# Patient Record
Sex: Female | Born: 1960 | Race: Black or African American | Hispanic: No | Smoking: Never smoker
Health system: Southern US, Community
[De-identification: ages and names within clinical notes are randomized; demographics above are authoritative.]

## PROBLEM LIST (undated history)

## (undated) DIAGNOSIS — D649 Anemia, unspecified: Secondary | ICD-10-CM

## (undated) DIAGNOSIS — Z9289 Personal history of other medical treatment: Secondary | ICD-10-CM

## (undated) HISTORY — PX: TUBAL LIGATION: SHX77

## (undated) HISTORY — PX: BREAST EXCISIONAL BIOPSY: SUR124

## (undated) HISTORY — PX: COLONOSCOPY: SHX174

## (undated) HISTORY — PX: WISDOM TOOTH EXTRACTION: SHX21

## (undated) HISTORY — PX: BREAST SURGERY: SHX581

---

## 2008-10-04 DIAGNOSIS — Z9289 Personal history of other medical treatment: Secondary | ICD-10-CM

## 2008-10-04 HISTORY — DX: Personal history of other medical treatment: Z92.89

## 2014-08-06 ENCOUNTER — Ambulatory Visit
Admission: RE | Admit: 2014-08-06 | Discharge: 2014-08-06 | Disposition: A | Discharge status: Home / Self Care | Payer: BC Managed Care – PPO | Source: Ambulatory Visit | Attending: Physician Assistant | Admitting: Physician Assistant

## 2014-08-06 ENCOUNTER — Other Ambulatory Visit: Payer: Self-pay | Admitting: Physician Assistant

## 2014-08-06 DIAGNOSIS — R52 Pain, unspecified: Secondary | ICD-10-CM

## 2014-10-07 ENCOUNTER — Other Ambulatory Visit (HOSPITAL_COMMUNITY)
Admission: RE | Admit: 2014-10-07 | Discharge: 2014-10-07 | Disposition: A | Discharge status: Home / Self Care | Payer: BC Managed Care – PPO | Source: Ambulatory Visit | Attending: Obstetrics and Gynecology | Admitting: Obstetrics and Gynecology

## 2014-10-07 ENCOUNTER — Other Ambulatory Visit: Payer: Self-pay | Admitting: Obstetrics and Gynecology

## 2014-10-07 DIAGNOSIS — Z1151 Encounter for screening for human papillomavirus (HPV): Secondary | ICD-10-CM | POA: Insufficient documentation

## 2014-10-07 DIAGNOSIS — Z01419 Encounter for gynecological examination (general) (routine) without abnormal findings: Secondary | ICD-10-CM | POA: Diagnosis present

## 2014-10-08 LAB — CYTOLOGY - PAP

## 2014-12-24 NOTE — Patient Instructions (Addendum)
   Your procedure is scheduled on:  Wednesday, March 30  Enter through the [employer] [facility] at:  [place] up the phone at the desk and dial [phone] and inform us of your arrival.  Please call this number if you have any problems the morning of surgery: [phone]  Remember: Do not eat food after midnight: Tuesday Do not drink clear liquids after: 6 AM Wednesday, day of surgery Take these medicines the morning of surgery with a SIP OF WATER:  None  Do not wear jewelry, make-up, or FINGER nail polish No metal in your hair or on your body. Do not wear lotions, powders, perfumes.  You may wear deodorant.  Do not bring valuables to the hospital. Contacts, dentures or bridgework may not be worn into surgery.  Leave suitcase in the car. After Surgery it may be brought to your room. For patients being admitted to the hospital, checkout time is 11:00am the day of discharge.  Home with daughter Janett Billow cell [phone]

## 2014-12-25 ENCOUNTER — Encounter (HOSPITAL_COMMUNITY)
Admission: RE | Admit: 2014-12-25 | Discharge: 2014-12-25 | Disposition: A | Discharge status: Home / Self Care | Payer: BC Managed Care – PPO | Source: Ambulatory Visit | Attending: Obstetrics and Gynecology | Admitting: Obstetrics and Gynecology

## 2014-12-25 ENCOUNTER — Encounter (HOSPITAL_COMMUNITY): Payer: Self-pay

## 2014-12-25 ENCOUNTER — Other Ambulatory Visit: Payer: Self-pay | Admitting: Obstetrics and Gynecology

## 2014-12-25 DIAGNOSIS — Z01812 Encounter for preprocedural laboratory examination: Secondary | ICD-10-CM | POA: Diagnosis not present

## 2014-12-25 HISTORY — DX: Personal history of other medical treatment: Z92.89

## 2014-12-25 HISTORY — DX: Anemia, unspecified: D64.9

## 2014-12-25 LAB — CBC
HEMATOCRIT: 36.3 % (ref 36.0–46.0)
HEMOGLOBIN: 11.8 g/dL — AB (ref 12.0–15.0)
MCH: 30.3 pg (ref 26.0–34.0)
MCHC: 32.5 g/dL (ref 30.0–36.0)
MCV: 93.3 fL (ref 78.0–100.0)
Platelets: 314 10*3/uL (ref 150–400)
RBC: 3.89 MIL/uL (ref 3.87–5.11)
RDW: 12.4 % (ref 11.5–15.5)
WBC: 4.6 10*3/uL (ref 4.0–10.5)

## 2014-12-25 LAB — ABO/RH: ABO/RH(D): A POS

## 2014-12-25 NOTE — Pre-Procedure Instructions (Signed)
SDS BB History Log given to lab for patient's history of blood transfusion.

## 2014-12-31 ENCOUNTER — Other Ambulatory Visit: Payer: Self-pay | Admitting: Obstetrics and Gynecology

## 2014-12-31 NOTE — H&P (Signed)
Chief Complaint(s):   PreOp history and physical for 01/01/15   HPI:  General 54 y/o presents for preop visit in preparation for robotic hysterectomy with bso. she desires to have her ovaries removed. she has hot flaseh. she has menorrhagia and fibroids. she desires definitive therapy. she has a menses every 26 days. she changes 14 pads on her heaviest day.  On ultrasound 11/04/2014 uterus measures 13.5x 10.4 cm x 9.8 cm. she has multiple uterine fibroids the largest is posterior and submucosal measuring 5.6 cm. Her ovaries are normal.  Current Medication:   None   Medical History:   Anemia-Iron deficiency [facility])   Allergies/Intolerance:   Sulfa Drugs: Side Effects - hives/nauesa     Codeine Sulfate: Side Effects - vomiting     Penicillin: Side Effects - vomiting/hives   Gyn History:   Sexual activity not currently sexually active. Periods : every month, lasts 7 days, 2-3 days are heavy and sometimes will go through 14 pads in a day. LMP 12/09/14. Birth control BTL. Last pap smear date 10/07/14, all negative. Last mammogram date more than two years. Denies Abnormal pap smear. Denies STD.   OB History:   Number of pregnancies 3. Pregnancy # 1 live birth, vaginal delivery. Pregnancy # 2 live birth, vaginal delivery. Pregnancy # 3 live birth, vaginal delivery, blood transfusion postpartum.   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    Denies family hx colon  cancer, colon polyps or liver disease.  Social History:  General Tobacco use cigarettes: Never smoked, Tobacco history last updated 12/24/2014.  no Smoking.  no Alcohol.  Caffeine: yes, very little, soda.  no Recreational drug use.  Exercise: yes.  Occupation: employed, Advertising account planner. asst.  Marital Status: single, Divorced.  Children: 3.  ROS: negative except as stated in hPI.   Objective:  Vitals:  Wt 158, Wt change -3 lb, Ht 62, BMI 28.90, Pulse sitting 72, BP sitting 113/78  Past Results:  Examination:  General Examination alert, oriented, NAD" label="GENERAL APPEARANCE" categoryPropId="[id number]" examid="[id number]"GENERAL APPEARANCE alert, oriented, NAD.  good I:E efffort noted, clear to auscultation bilaterally" label="LUNGS:" categoryPropId="87" examid="[id number]"LUNGS: good I:E efffort noted, clear to auscultation bilaterally.  regular rate and rhythm" label="HEART:" categoryPropId="86" examid="[id number]"HEART: regular rate and rhythm.  soft, non-tender/non-distended, bowel sounds present" label="ABDOMEN:" categoryPropId="88" examid="[id number]"ABDOMEN: soft, non-tender/non-distended, bowel sounds present.  normal external genitalia, labia - unremarkable, vagina - pink moist mucosa, no lesions or abnormal discharge, cervix - no discharge or lesions or CMT, adnexa - no masses or tenderness, uterus nontender 16 wk size uterus. " label="FEMALE GENITOURINARY:" categoryPropId="[id number]" examid="[id number]"FEMALE GENITOURINARY: normal external genitalia, labia - unremarkable, vagina - pink moist mucosa, no lesions or abnormal discharge, cervix - no discharge or lesions or CMT, adnexa - no masses or tenderness, uterus nontender 16 wk size uterus. .  normal range of  motion all joints" label="MUSCULOSKELETAL" categoryPropId="13767" examid="[id number]"MUSCULOSKELETAL normal range of motion all joints.  no edema present" label="EXTREMITIES:" categoryPropId="89" examid="[id number]"EXTREMITIES: no edema present.  Physical  Examination: Chaperone present McCoy,Tiffany 12/24/2014 02:24:35 PM &gt; , for pelvic exam" label="Chaperone present"Chaperone present McCoy,Tiffany 12/24/2014 02:24:35 PM > , for pelvic exam.    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 plan on robotic assisted laparascopic hysterectomy with bso.. d/w the patient r/b/a of surgery including but not limited to infection/ bleeding / damage to bowel bladder ureters with the need for further surgery. r/o converstion to abdominal hysterctomy. r/o transfusion. pt voiced understanding and desires to proceed with robotic hysterectomy.  Submucous leiomyoma of uterus  Notes: robotic hysterctomy for management.  Iron deficiency anemia due to chronic blood loss  Notes: due to menorrhagia.

## 2015-01-01 ENCOUNTER — Encounter (HOSPITAL_COMMUNITY): Payer: Self-pay | Admitting: *Deleted

## 2015-01-01 ENCOUNTER — Encounter (HOSPITAL_COMMUNITY)
Admission: RE | Disposition: A | Discharge status: Home / Self Care | Payer: Self-pay | Source: Ambulatory Visit | Attending: Obstetrics and Gynecology

## 2015-01-01 ENCOUNTER — Ambulatory Visit (HOSPITAL_COMMUNITY): Payer: BC Managed Care – PPO | Admitting: Anesthesiology

## 2015-01-01 ENCOUNTER — Inpatient Hospital Stay (HOSPITAL_COMMUNITY)
Admission: RE | Admit: 2015-01-01 | Discharge: 2015-01-03 | DRG: 742 | Disposition: A | Discharge status: Home / Self Care | Payer: BC Managed Care – PPO | Source: Ambulatory Visit | Attending: Obstetrics and Gynecology | Admitting: Obstetrics and Gynecology

## 2015-01-01 ENCOUNTER — Ambulatory Visit (HOSPITAL_COMMUNITY): Payer: BC Managed Care – PPO

## 2015-01-01 DIAGNOSIS — D259 Leiomyoma of uterus, unspecified: Secondary | ICD-10-CM | POA: Diagnosis present

## 2015-01-01 DIAGNOSIS — Z90722 Acquired absence of ovaries, bilateral: Secondary | ICD-10-CM

## 2015-01-01 DIAGNOSIS — N9961 Intraoperative hemorrhage and hematoma of a genitourinary system organ or structure complicating a genitourinary system procedure: Secondary | ICD-10-CM | POA: Diagnosis not present

## 2015-01-01 DIAGNOSIS — N920 Excessive and frequent menstruation with regular cycle: Secondary | ICD-10-CM | POA: Diagnosis present

## 2015-01-01 DIAGNOSIS — D649 Anemia, unspecified: Secondary | ICD-10-CM | POA: Diagnosis present

## 2015-01-01 DIAGNOSIS — T81509A Unspecified complication of foreign body accidentally left in body following unspecified procedure, initial encounter: Secondary | ICD-10-CM

## 2015-01-01 DIAGNOSIS — N938 Other specified abnormal uterine and vaginal bleeding: Secondary | ICD-10-CM | POA: Diagnosis present

## 2015-01-01 DIAGNOSIS — Z90710 Acquired absence of both cervix and uterus: Secondary | ICD-10-CM

## 2015-01-01 DIAGNOSIS — Z9079 Acquired absence of other genital organ(s): Secondary | ICD-10-CM

## 2015-01-01 HISTORY — PX: ROBOTIC ASSISTED TOTAL HYSTERECTOMY WITH BILATERAL SALPINGO OOPHERECTOMY: SHX6086

## 2015-01-01 LAB — PREPARE RBC (CROSSMATCH)

## 2015-01-01 LAB — PREGNANCY, URINE: Preg Test, Ur: NEGATIVE

## 2015-01-01 LAB — CBC
HCT: 39.5 % (ref 36.0–46.0)
Hemoglobin: 13.5 g/dL (ref 12.0–15.0)
MCH: 31 pg (ref 26.0–34.0)
MCHC: 34.2 g/dL (ref 30.0–36.0)
MCV: 90.6 fL (ref 78.0–100.0)
Platelets: 203 10*3/uL (ref 150–400)
RBC: 4.36 MIL/uL (ref 3.87–5.11)
RDW: 13.8 % (ref 11.5–15.5)
WBC: 22.8 10*3/uL — ABNORMAL HIGH (ref 4.0–10.5)

## 2015-01-01 SURGERY — ROBOTIC ASSISTED TOTAL HYSTERECTOMY WITH BILATERAL SALPINGO OOPHORECTOMY
Anesthesia: General | Site: Abdomen | Laterality: Bilateral

## 2015-01-01 MED ORDER — PHENYLEPHRINE HCL 10 MG/ML IJ SOLN
INTRAMUSCULAR | Status: AC
  Filled 2015-01-01: qty 1

## 2015-01-01 MED ORDER — LACTATED RINGERS IV SOLN
INTRAVENOUS | Status: DC
  Administered 2015-01-01 (×5): via INTRAVENOUS

## 2015-01-01 MED ORDER — HYDROMORPHONE HCL 1 MG/ML IJ SOLN
INTRAMUSCULAR | Status: DC | PRN
  Administered 2015-01-01: 1 mg via INTRAVENOUS

## 2015-01-01 MED ORDER — FENTANYL CITRATE 0.05 MG/ML IJ SOLN
INTRAMUSCULAR | Status: AC
  Filled 2015-01-01: qty 5

## 2015-01-01 MED ORDER — ONDANSETRON HCL 4 MG/2ML IJ SOLN: 4.0000 mg | Freq: Four times a day (QID) | INTRAMUSCULAR | Status: DC | PRN

## 2015-01-01 MED ORDER — ROPIVACAINE HCL 5 MG/ML IJ SOLN
INTRAMUSCULAR | Status: AC
Start: 2015-01-01 — End: 2015-01-01
  Filled 2015-01-01: qty 60

## 2015-01-01 MED ORDER — PHENYLEPHRINE 40 MCG/ML (10ML) SYRINGE FOR IV PUSH (FOR BLOOD PRESSURE SUPPORT)
PREFILLED_SYRINGE | INTRAVENOUS | Status: AC
  Filled 2015-01-01: qty 10

## 2015-01-01 MED ORDER — PANTOPRAZOLE SODIUM 40 MG IV SOLR
40.0000 mg | Freq: Every day | INTRAVENOUS | Status: DC
  Administered 2015-01-02: 40 mg via INTRAVENOUS
  Filled 2015-01-01: qty 40

## 2015-01-01 MED ORDER — NEOSTIGMINE METHYLSULFATE 10 MG/10ML IV SOLN
INTRAVENOUS | Status: AC
  Filled 2015-01-01: qty 1

## 2015-01-01 MED ORDER — SIMETHICONE 80 MG PO CHEW
80.0000 mg | CHEWABLE_TABLET | Freq: Four times a day (QID) | ORAL | Status: DC | PRN
  Administered 2015-01-02 (×2): 80 mg via ORAL
  Filled 2015-01-01 (×2): qty 1

## 2015-01-01 MED ORDER — ARTIFICIAL TEARS OP OINT
TOPICAL_OINTMENT | OPHTHALMIC | Status: DC | PRN
  Administered 2015-01-01: 1 via OPHTHALMIC

## 2015-01-01 MED ORDER — PROMETHAZINE HCL 25 MG/ML IJ SOLN: 6.2500 mg | Freq: Once | INTRAMUSCULAR | Status: DC

## 2015-01-01 MED ORDER — LACTATED RINGERS IV SOLN
INTRAVENOUS | Status: DC
  Administered 2015-01-01 – 2015-01-02 (×2): via INTRAVENOUS

## 2015-01-01 MED ORDER — PROPOFOL 10 MG/ML IV BOLUS
INTRAVENOUS | Status: DC | PRN
  Administered 2015-01-01: 150 mg via INTRAVENOUS

## 2015-01-01 MED ORDER — NALOXONE HCL 0.4 MG/ML IJ SOLN: 0.4000 mg | INTRAMUSCULAR | Status: DC | PRN

## 2015-01-01 MED ORDER — ONDANSETRON HCL 4 MG PO TABS: 4.0000 mg | ORAL_TABLET | Freq: Four times a day (QID) | ORAL | Status: DC | PRN

## 2015-01-01 MED ORDER — SILVER NITRATE-POT NITRATE 75-25 % EX MISC
CUTANEOUS | Status: DC | PRN
  Administered 2015-01-01: 2

## 2015-01-01 MED ORDER — MIDAZOLAM HCL 2 MG/2ML IJ SOLN
INTRAMUSCULAR | Status: DC | PRN
  Administered 2015-01-01: 2 mg via INTRAVENOUS

## 2015-01-01 MED ORDER — DEXAMETHASONE SODIUM PHOSPHATE 10 MG/ML IJ SOLN
INTRAMUSCULAR | Status: AC
  Filled 2015-01-01: qty 1

## 2015-01-01 MED ORDER — NEOSTIGMINE METHYLSULFATE 10 MG/10ML IV SOLN
INTRAVENOUS | Status: DC | PRN
  Administered 2015-01-01: 3 mg via INTRAVENOUS

## 2015-01-01 MED ORDER — CLINDAMYCIN PHOSPHATE 900 MG/50ML IV SOLN
900.0000 mg | Freq: Once | INTRAVENOUS | Status: AC
  Administered 2015-01-01: 900 mg via INTRAVENOUS
  Filled 2015-01-01: qty 50

## 2015-01-01 MED ORDER — PROMETHAZINE HCL 25 MG/ML IJ SOLN
INTRAMUSCULAR | Status: AC
  Filled 2015-01-01: qty 1

## 2015-01-01 MED ORDER — SODIUM CHLORIDE 0.9 % IV SOLN: 10.0000 mL/h | Freq: Once | INTRAVENOUS | Status: DC

## 2015-01-01 MED ORDER — LIDOCAINE HCL (CARDIAC) 20 MG/ML IV SOLN
INTRAVENOUS | Status: DC | PRN
  Administered 2015-01-01: 80 mg via INTRAVENOUS

## 2015-01-01 MED ORDER — SCOPOLAMINE 1 MG/3DAYS TD PT72
MEDICATED_PATCH | TRANSDERMAL | Status: AC
  Administered 2015-01-01: 1.5 mg via TRANSDERMAL
  Filled 2015-01-01: qty 1

## 2015-01-01 MED ORDER — PHENYLEPHRINE 40 MCG/ML (10ML) SYRINGE FOR IV PUSH (FOR BLOOD PRESSURE SUPPORT)
PREFILLED_SYRINGE | INTRAVENOUS | Status: AC
Start: 2015-01-01 — End: 2015-01-01
  Filled 2015-01-01: qty 10

## 2015-01-01 MED ORDER — LIDOCAINE-EPINEPHRINE 1 %-1:100000 IJ SOLN
INTRAMUSCULAR | Status: DC | PRN
  Administered 2015-01-01: 26 mL

## 2015-01-01 MED ORDER — SODIUM CHLORIDE 0.9 % IV SOLN
INTRAVENOUS | Status: DC | PRN
  Administered 2015-01-01: 16:00:00 via INTRAVENOUS

## 2015-01-01 MED ORDER — HYDROMORPHONE 0.3 MG/ML IV SOLN
INTRAVENOUS | Status: DC
  Administered 2015-01-01: 20:00:00 via INTRAVENOUS
  Administered 2015-01-02: 0.6 mg via INTRAVENOUS
  Administered 2015-01-02: 0.9 mg via INTRAVENOUS
  Administered 2015-01-02: 0.6 mg via INTRAVENOUS
  Filled 2015-01-01: qty 25

## 2015-01-01 MED ORDER — ONDANSETRON HCL 4 MG/2ML IJ SOLN
INTRAMUSCULAR | Status: DC | PRN
  Administered 2015-01-01: 4 mg via INTRAVENOUS

## 2015-01-01 MED ORDER — METOCLOPRAMIDE HCL 5 MG/ML IJ SOLN
10.0000 mg | Freq: Once | INTRAMUSCULAR | Status: AC | PRN
  Administered 2015-01-01: 10 mg via INTRAVENOUS

## 2015-01-01 MED ORDER — LIDOCAINE-EPINEPHRINE 1 %-1:100000 IJ SOLN
INTRAMUSCULAR | Status: AC
  Filled 2015-01-01: qty 1

## 2015-01-01 MED ORDER — HYDROMORPHONE HCL 1 MG/ML IJ SOLN
0.5000 mg | INTRAMUSCULAR | Status: DC | PRN
  Administered 2015-01-01: 0.5 mg via INTRAVENOUS

## 2015-01-01 MED ORDER — FENTANYL CITRATE 0.05 MG/ML IJ SOLN: 25.0000 ug | INTRAMUSCULAR | Status: DC | PRN

## 2015-01-01 MED ORDER — DIPHENHYDRAMINE HCL 12.5 MG/5ML PO ELIX: 12.5000 mg | ORAL_SOLUTION | Freq: Four times a day (QID) | ORAL | Status: DC | PRN

## 2015-01-01 MED ORDER — SCOPOLAMINE 1 MG/3DAYS TD PT72
1.0000 | MEDICATED_PATCH | Freq: Once | TRANSDERMAL | Status: DC
  Administered 2015-01-01: 1.5 mg via TRANSDERMAL

## 2015-01-01 MED ORDER — HYDROMORPHONE HCL 1 MG/ML IJ SOLN
INTRAMUSCULAR | Status: AC
  Filled 2015-01-01: qty 1

## 2015-01-01 MED ORDER — SODIUM CHLORIDE 0.9 % IJ SOLN
INTRAMUSCULAR | Status: AC
  Filled 2015-01-01: qty 10

## 2015-01-01 MED ORDER — SILVER NITRATE-POT NITRATE 75-25 % EX MISC
CUTANEOUS | Status: AC
  Filled 2015-01-01: qty 1

## 2015-01-01 MED ORDER — PROPOFOL 10 MG/ML IV BOLUS
INTRAVENOUS | Status: AC
  Filled 2015-01-01: qty 20

## 2015-01-01 MED ORDER — ONDANSETRON HCL 4 MG/2ML IJ SOLN
INTRAMUSCULAR | Status: AC
  Filled 2015-01-01: qty 2

## 2015-01-01 MED ORDER — LACTATED RINGERS IR SOLN
Status: DC | PRN
  Administered 2015-01-01: 3000 mL

## 2015-01-01 MED ORDER — PHENYLEPHRINE HCL 10 MG/ML IJ SOLN
INTRAMUSCULAR | Status: DC | PRN
  Administered 2015-01-01 (×5): 80 ug via INTRAVENOUS
  Administered 2015-01-01 (×2): 40 ug via INTRAVENOUS
  Administered 2015-01-01 (×5): 80 ug via INTRAVENOUS
  Administered 2015-01-01 (×2): 40 ug via INTRAVENOUS

## 2015-01-01 MED ORDER — GENTAMICIN SULFATE 40 MG/ML IJ SOLN
INTRAVENOUS | Status: AC
  Administered 2015-01-01: 113.25 mL via INTRAVENOUS
  Filled 2015-01-01: qty 7.25

## 2015-01-01 MED ORDER — DEXAMETHASONE SODIUM PHOSPHATE 4 MG/ML IJ SOLN
INTRAMUSCULAR | Status: DC | PRN
  Administered 2015-01-01: 4 mg via INTRAVENOUS

## 2015-01-01 MED ORDER — SODIUM CHLORIDE 0.9 % IV SOLN
INTRAVENOUS | Status: DC | PRN
  Administered 2015-01-01: 80 mL
  Administered 2015-01-01: 10 mL

## 2015-01-01 MED ORDER — FENTANYL CITRATE 0.05 MG/ML IJ SOLN
INTRAMUSCULAR | Status: DC | PRN
  Administered 2015-01-01: 50 ug via INTRAVENOUS
  Administered 2015-01-01 (×2): 25 ug via INTRAVENOUS
  Administered 2015-01-01 (×3): 50 ug via INTRAVENOUS
  Administered 2015-01-01: 100 ug via INTRAVENOUS
  Administered 2015-01-01: 25 ug via INTRAVENOUS

## 2015-01-01 MED ORDER — MIDAZOLAM HCL 2 MG/2ML IJ SOLN
INTRAMUSCULAR | Status: AC
  Filled 2015-01-01: qty 2

## 2015-01-01 MED ORDER — ALUM & MAG HYDROXIDE-SIMETH 200-200-20 MG/5ML PO SUSP: 30.0000 mL | ORAL | Status: DC | PRN

## 2015-01-01 MED ORDER — SODIUM CHLORIDE 0.9 % IJ SOLN
INTRAMUSCULAR | Status: AC
  Filled 2015-01-01: qty 50

## 2015-01-01 MED ORDER — LIDOCAINE HCL (CARDIAC) 20 MG/ML IV SOLN
INTRAVENOUS | Status: AC
  Filled 2015-01-01: qty 5

## 2015-01-01 MED ORDER — DIPHENHYDRAMINE HCL 50 MG/ML IJ SOLN: 12.5000 mg | Freq: Four times a day (QID) | INTRAMUSCULAR | Status: DC | PRN

## 2015-01-01 MED ORDER — ROCURONIUM BROMIDE 100 MG/10ML IV SOLN
INTRAVENOUS | Status: DC | PRN
  Administered 2015-01-01: 45 mg via INTRAVENOUS
  Administered 2015-01-01 (×2): 10 mg via INTRAVENOUS
  Administered 2015-01-01: 5 mg via INTRAVENOUS
  Administered 2015-01-01: 10 mg via INTRAVENOUS

## 2015-01-01 MED ORDER — GLYCOPYRROLATE 0.2 MG/ML IJ SOLN
INTRAMUSCULAR | Status: DC | PRN
  Administered 2015-01-01: 0.1 mg via INTRAVENOUS
  Administered 2015-01-01: .4 mg via INTRAVENOUS

## 2015-01-01 MED ORDER — SODIUM CHLORIDE 0.9 % IJ SOLN: 9.0000 mL | INTRAMUSCULAR | Status: DC | PRN

## 2015-01-01 MED ORDER — MEPERIDINE HCL 25 MG/ML IJ SOLN: 6.2500 mg | INTRAMUSCULAR | Status: DC | PRN

## 2015-01-01 MED ORDER — METOCLOPRAMIDE HCL 5 MG/ML IJ SOLN
INTRAMUSCULAR | Status: AC
  Filled 2015-01-01: qty 2

## 2015-01-01 MED ORDER — MENTHOL 3 MG MT LOZG: 1.0000 | LOZENGE | OROMUCOSAL | Status: DC | PRN

## 2015-01-01 SURGICAL SUPPLY — 84 items
BARRIER ADHS 3X4 INTERCEED (GAUZE/BANDAGES/DRESSINGS) IMPLANT
BENZOIN TINCTURE PRP APPL 2/3 (GAUZE/BANDAGES/DRESSINGS) ×3 IMPLANT
CATH FOLEY 3WAY  5CC 16FR (CATHETERS) ×2
CATH FOLEY 3WAY 5CC 16FR (CATHETERS) ×1 IMPLANT
CLEANER TIP ELECTROSURG 2X2 (MISCELLANEOUS) ×3 IMPLANT
CLOSURE WOUND 1/4X4 (GAUZE/BANDAGES/DRESSINGS) ×1
CONT PATH 16OZ SNAP LID 3702 (MISCELLANEOUS) ×3 IMPLANT
COVER BACK TABLE 60X90IN (DRAPES) ×6 IMPLANT
COVER TIP SHEARS 8 DVNC (MISCELLANEOUS) ×1 IMPLANT
COVER TIP SHEARS 8MM DA VINCI (MISCELLANEOUS) ×2
DECANTER SPIKE VIAL GLASS SM (MISCELLANEOUS) ×12 IMPLANT
DILATOR CANAL MILEX (MISCELLANEOUS) ×3 IMPLANT
DRAPE WARM FLUID 44X44 (DRAPE) ×3 IMPLANT
DRESSING OPSITE X SMALL 2X3 (GAUZE/BANDAGES/DRESSINGS) ×3 IMPLANT
DRSG COVADERM PLUS 2X2 (GAUZE/BANDAGES/DRESSINGS) ×12 IMPLANT
DRSG OPSITE POSTOP 3X4 (GAUZE/BANDAGES/DRESSINGS) ×3 IMPLANT
DRSG OPSITE POSTOP 4X10 (GAUZE/BANDAGES/DRESSINGS) ×3 IMPLANT
DURAPREP 26ML APPLICATOR (WOUND CARE) ×3 IMPLANT
ELECT LIGASURE SHORT 9 REUSE (ELECTRODE) ×3 IMPLANT
ELECT REM PT RETURN 9FT ADLT (ELECTROSURGICAL) ×3
ELECTRODE REM PT RTRN 9FT ADLT (ELECTROSURGICAL) ×1 IMPLANT
GAUZE SPONGE 4X4 16PLY XRAY LF (GAUZE/BANDAGES/DRESSINGS) ×3 IMPLANT
GAUZE VASELINE 3X9 (GAUZE/BANDAGES/DRESSINGS) IMPLANT
GLOVE BIOGEL M 6.5 STRL (GLOVE) ×9 IMPLANT
GLOVE BIOGEL PI IND STRL 6.5 (GLOVE) ×1 IMPLANT
GLOVE BIOGEL PI IND STRL 7.0 (GLOVE) ×4 IMPLANT
GLOVE BIOGEL PI INDICATOR 6.5 (GLOVE) ×2
GLOVE BIOGEL PI INDICATOR 7.0 (GLOVE) ×8
GYRUS RUMI II 2.5CM BLUE (DISPOSABLE)
GYRUS RUMI II 3.5CM BLUE (DISPOSABLE)
GYRUS RUMI II 4.0CM BLUE (DISPOSABLE)
KIT ACCESSORY DA VINCI DISP (KITS) ×2
KIT ACCESSORY DVNC DISP (KITS) ×1 IMPLANT
LEGGING LITHOTOMY PAIR STRL (DRAPES) ×3 IMPLANT
LIQUID BAND (GAUZE/BANDAGES/DRESSINGS) ×3 IMPLANT
MATRIX HEMOSTAT SURGIFLO (HEMOSTASIS) ×3 IMPLANT
OCCLUDER COLPOPNEUMO (BALLOONS) IMPLANT
PACK ROBOT WH (CUSTOM PROCEDURE TRAY) ×3 IMPLANT
PACK ROBOTIC GOWN (GOWN DISPOSABLE) ×3 IMPLANT
PAD POSITIONER PINK NONSTERILE (MISCELLANEOUS) ×3 IMPLANT
PAD PREP 24X48 CUFFED NSTRL (MISCELLANEOUS) ×6 IMPLANT
PENCIL BUTTON HOLSTER BLD 10FT (ELECTRODE) ×3 IMPLANT
RUMI II 3.0CM BLUE KOH-EFFICIE (DISPOSABLE) IMPLANT
RUMI II GYRUS 2.5CM BLUE (DISPOSABLE) IMPLANT
RUMI II GYRUS 3.5CM BLUE (DISPOSABLE) IMPLANT
RUMI II GYRUS 4.0CM BLUE (DISPOSABLE) IMPLANT
SEALER TISSUE G2 CVD JAW 35 (ENDOMECHANICALS) ×1 IMPLANT
SEALER TISSUE G2 CVD JAW 45CM (ENDOMECHANICALS) ×2
SET CYSTO W/LG BORE CLAMP LF (SET/KITS/TRAYS/PACK) ×3 IMPLANT
SET IRRIG TUBING LAPAROSCOPIC (IRRIGATION / IRRIGATOR) ×3 IMPLANT
SET TRI-LUMEN FLTR TB AIRSEAL (TUBING) ×3 IMPLANT
SPONGE LAP 18X18 X RAY DECT (DISPOSABLE) ×12 IMPLANT
STRIP CLOSURE SKIN 1/4X4 (GAUZE/BANDAGES/DRESSINGS) ×2 IMPLANT
SUT VIC AB 0 CT1 18XCR BRD8 (SUTURE) ×1 IMPLANT
SUT VIC AB 0 CT1 27 (SUTURE) ×10
SUT VIC AB 0 CT1 27XBRD ANBCTR (SUTURE) ×2 IMPLANT
SUT VIC AB 0 CT1 27XBRD ANTBC (SUTURE) ×1 IMPLANT
SUT VIC AB 0 CT1 27XCR 8 STRN (SUTURE) ×2 IMPLANT
SUT VIC AB 0 CT1 36 (SUTURE) ×6 IMPLANT
SUT VIC AB 0 CT1 8-18 (SUTURE) ×2
SUT VIC AB 2-0 SH 27 (SUTURE) ×2
SUT VIC AB 2-0 SH 27XBRD (SUTURE) ×1 IMPLANT
SUT VICRYL 0 27 CT2 27 ABS (SUTURE) ×18 IMPLANT
SUT VICRYL 0 UR6 27IN ABS (SUTURE) ×3 IMPLANT
SUT VICRYL RAPIDE 4/0 PS 2 (SUTURE) ×6 IMPLANT
SYR 50ML LL SCALE MARK (SYRINGE) ×6 IMPLANT
TIP RUMI ORANGE 6.7MMX12CM (TIP) ×3 IMPLANT
TIP UTERINE 5.1X6CM LAV DISP (MISCELLANEOUS) IMPLANT
TIP UTERINE 6.7X10CM GRN DISP (MISCELLANEOUS) IMPLANT
TIP UTERINE 6.7X6CM WHT DISP (MISCELLANEOUS) IMPLANT
TIP UTERINE 6.7X8CM BLUE DISP (MISCELLANEOUS) IMPLANT
TOWEL OR 17X24 6PK STRL BLUE (TOWEL DISPOSABLE) ×12 IMPLANT
TROCAR 12M 150ML BLUNT (TROCAR) ×3 IMPLANT
TROCAR DISP BLADELESS 8 DVNC (TROCAR) ×1 IMPLANT
TROCAR DISP BLADELESS 8MM (TROCAR) ×2
TROCAR PORT AIRSEAL 5X120 (TROCAR) ×3 IMPLANT
TROCAR XCEL 12X100 BLDLESS (ENDOMECHANICALS) IMPLANT
TROCAR XCEL NON-BLD 11X100MML (ENDOMECHANICALS) ×3 IMPLANT
TROCAR XCEL NON-BLD 5MMX100MML (ENDOMECHANICALS) ×6 IMPLANT
TUBING NON-CON 1/4 X 20 CONN (TUBING) ×2 IMPLANT
TUBING NON-CON 1/4 X 20' CONN (TUBING) ×1
WARMER LAPAROSCOPE (MISCELLANEOUS) ×3 IMPLANT
WATER STERILE IRR 1000ML POUR (IV SOLUTION) ×9 IMPLANT
YANKAUER SUCT BULB TIP NO VENT (SUCTIONS) ×3 IMPLANT

## 2015-01-01 NOTE — Anesthesia Procedure Notes (Signed)
Procedure Name: Intubation Date/Time: 01/01/2015 12:38 PM Performed by: Flossie Dibble Pre-anesthesia Checklist: Patient identified, Timeout performed, Emergency Drugs available, Suction available and Patient being monitored Patient Re-evaluated:Patient Re-evaluated prior to inductionOxygen Delivery Method: Circle system utilized Preoxygenation: Pre-oxygenation with 100% oxygen Intubation Type: IV induction Ventilation: Mask ventilation without difficulty and Oral airway inserted - appropriate to patient size Laryngoscope Size: Mac and 3 Grade View: Grade I Tube type: Oral Tube size: 7.0 mm Number of attempts: 1 Airway Equipment and Method: Patient positioned with wedge pillow and Stylet Secured at: 21.5 cm Tube secured with: Tape Dental Injury: Teeth and Oropharynx as per pre-operative assessment

## 2015-01-01 NOTE — OR Nursing (Signed)
X-ray for incorrect count at end of procedure negative per Dr. Jobe Igo

## 2015-01-01 NOTE — Anesthesia Postprocedure Evaluation (Signed)
  Anesthesia Post-op Note  Patient: Alicia Sweeney  Procedure(s) Performed: Procedure(s): ATTEMPED LAPAROSCOPIC  ASSISTED TOTAL VAGINAL HYSTERECTOMY WITH CONVERSTION TO ABDOMINAL HYSTERECTOMY WITH BILATERAL SALPINGO OOPHORECTOMY (Bilateral)  Patient Location: PACU  Anesthesia Type:General  Level of Consciousness: awake, alert  and oriented  Airway and Oxygen Therapy: Patient Spontanous Breathing and Patient connected to nasal cannula oxygen  Post-op Pain: mild  Post-op Assessment: Post-op Vital signs reviewed, Patient's Cardiovascular Status Stable, Respiratory Function Stable, Patent Airway, NAUSEA AND VOMITING PRESENT and Pain level controlled  Post-op Vital Signs: Reviewed and stable  Last Vitals:  Filed Vitals:   01/01/15 1930  BP: 99/76  Pulse: 93  Temp:   Resp: 18    Complications: No apparent anesthesia complications

## 2015-01-01 NOTE — Transfer of Care (Signed)
Immediate Anesthesia Transfer of Care Note  Patient: Alicia Sweeney  Procedure(s) Performed: Procedure(s): ATTEMPED LAPAROSCOPIC  ASSISTED TOTAL VAGINAL HYSTERECTOMY WITH CONVERSTION TO ABDOMINAL HYSTERECTOMY WITH BILATERAL SALPINGO OOPHORECTOMY (Bilateral)  Patient Location: PACU  Anesthesia Type:General  Level of Consciousness: awake, alert  and oriented  Airway & Oxygen Therapy: Patient Spontanous Breathing and Patient connected to nasal cannula oxygen  Post-op Assessment: Report given to RN and Post -op Vital signs reviewed and stable  Post vital signs: Reviewed and stable  Last Vitals:  Filed Vitals:   01/01/15 1109  BP: 103/86  Pulse: 83  Temp: 36.7 C  Resp: 18    Complications: No apparent anesthesia complications

## 2015-01-01 NOTE — Anesthesia Preprocedure Evaluation (Signed)
Anesthesia Evaluation  Patient identified by MRN, date of birth, ID band Patient awake    Reviewed: Allergy & Precautions, NPO status , Patient's Chart, lab work & pertinent test results  Airway Mallampati: II  TM Distance: >3 FB Neck ROM: Full    Dental no notable dental hx. (+) Teeth Intact   Pulmonary neg pulmonary ROS,  breath sounds clear to auscultation  Pulmonary exam normal       Cardiovascular negative cardio ROS  Rhythm:Regular Rate:Normal     Neuro/Psych negative neurological ROS  negative psych ROS   GI/Hepatic negative GI ROS, Neg liver ROS,   Endo/Other  negative endocrine ROS  Renal/GU negative Renal ROS  negative genitourinary   Musculoskeletal negative musculoskeletal ROS (+)   Abdominal   Peds  Hematology  (+) anemia ,   Anesthesia Other Findings   Reproductive/Obstetrics Fibroid uterus                             Anesthesia Physical Anesthesia Plan  ASA: II  Anesthesia Plan: General   Post-op Pain Management:    Induction: Intravenous  Airway Management Planned: Oral ETT  Additional Equipment:   Intra-op Plan:   Post-operative Plan: Extubation in OR  Informed Consent: I have reviewed the patients History and Physical, chart, labs and discussed the procedure including the risks, benefits and alternatives for the proposed anesthesia with the patient or authorized representative who has indicated his/her understanding and acceptance.   Dental advisory given  Plan Discussed with: CRNA, Anesthesiologist and Surgeon  Anesthesia Plan Comments:         Anesthesia Quick Evaluation

## 2015-01-01 NOTE — Op Note (Addendum)
01/01/2015  5:51 PM  PATIENT:  Alicia Sweeney  54 y.o. female  PRE-OPERATIVE DIAGNOSIS:  D25.0 Fibroids, menorrhagia  POST-OPERATIVE DIAGNOSIS:  D25.0 Fibroids, menorrhagia  PROCEDURE:  Procedure(s): ATTEMPED LAPAROSCOPIC  ASSISTED TOTAL VAGINAL HYSTERECTOMY WITH CONVERSTION TO ABDOMINAL HYSTERECTOMY WITH BILATERAL SALPINGO OOPHORECTOMY (Bilateral)  SURGEON:  Surgeon(s) and Role:    * Christophe Louis, MD - Primary    * Thurnell Lose, MD - Assisting  PHYSICIAN ASSISTANT: None  ASSISTANTS: Dr. Renaee Munda was needed to assist due to complexity of the surgery and concern for pelvic adhesive disease   ANESTHESIA:   general  EBL: 2000  Total I/O In: 5405 [I.V.:4400; Blood:1005] Out: 2250 [Urine:250; Blood:2000]  BLOOD ADMINISTERED:1005 CC PRBC  DRAINS: Urinary Catheter (Foley)   LOCAL MEDICATIONS USED:  OTHER  ropivacaine   SPECIMEN:  Source of Specimen:  uterus cervix bilateral fallopian tubes and ovaries   DISPOSITION OF SPECIMEN:  PATHOLOGY  COUNTS:  YES  TOURNIQUET:  * No tourniquets in log *  DICTATION: .Dragon Dictation  PLAN OF CARE: Admit to inpatient   PATIENT DISPOSITION:  PACU - hemodynamically stable.   Delay start of Pharmacological VTE agent (>24hrs) due to surgical blood loss or risk of bleeding: not applicable  Findings Large uterus with multiple fibroids. 2 cm cyst on the left ovary .Marland Kitchen Normal fallopian tubes and normal appearing Right ovary.   Indication. 54 y/o with Uterine fibroids/ Menorrhagia and Anemia.   Procedure: the patient was taken to the operating room placed under general anesthesia. Prepped and draped in the normal sterile fashion. A foley catheter was placed. A Ru mi  uterine manipulator was placed. Attention was turned to the abdomen where the skin 4 cm above the umbilicus was injected with 10 cc of ropivacaine. A 12 mm trocar was placed under direct visualization. Pneumoperitoneum was achieved with C02 gas... 60 cc of ropivacaine was  injected into the abdominal cavity...  The intent was to perform a DaVinci robotic assisted laparoscopic hysterectomy however the DaVinci robot was not functional due to stripped power cord. Therefore decision was made to attempt the case laparoscopically without robotic assistance. ... A 5 mm trocar was placed in the right and left  Upper quadrants due to the size of the uterus . Each trocar site was injected with 10 cc of  ropivacaine prior to trocar placement. .  The Left Round ligament was cauterized and transected with the Enseal. The bladder flap was dissected to the midline with the enseal.  The Right Round ligament was cauterized and transected with the Enseal. The bladder flap was then dissected to the midline. ...  The right ureter was identified. The right  infundibulopelvic ligament was cauterized and transected with the  Enseal.  Followed by the broad ligament and the round ligament.  The left ureter could not be identified easily due to the colon and size of the uterus. Therefore the Left uteroovarian ligament was cauterized and transected with the Enseal.. Pt was noted to have slight bleeding from the medial aspect of the uteroovarian ligament this was  Controlled with the Kleppinger.  All pedicles were inspected and appeared to be hemostatic   Attention was then turned to the vagina. A weighted speculum was placed in to the vagina and the cervix was grasped with a toothed tenaculum. The cervix was then injected circumferentially with 1% xylocaine with 1:100K of epinephrine. The cervix was then circumferentially incised with the Bovie and the bladder was dissected off the pubovesical cervical fascia. The  anterior cul-de-sac as entered sharply. The same procedure was performed posteriorly and the posterior cu-lde-sac was entered sharply without difficulty. Bleeding was noted once the posterior cul-de-sac was entered. The source could not be visualized clearly.  A Heaney clamp was placed over the  uterosacral ligaments bilaterally., These were transected and suture ligated with 0 vicryl. The cardinal ligaments were then grasped with the ligasure cauterized and transected.  The right  uterine artery Then clamped with ligasure cauterized and , transected. At this point more bleeding was noted however the source could not be determined from below. A decision was made to proceed with a pfannenstiel incision to allow for better visualization in efforts to achieve hemostasis. .   Attention was turned to the abdomen where a pfannenstiel incision was made with the scalpel.  The fascia was incised in the midline and extended laterally. The rectus muscles were separated and the peritoneum was identified and entered bluntly. O Connor sullivan retractor was placed and the bowel was packed away with moist laparotomy sponges. The bleeding was noted from the superior aspect of the right uterine artery. This was doubly clamped with the McClure. Transected and suture ligated with 0 vicryl. This was repeated on the left uterine artery.  At this point the uterus cervix and right fallopian tube and ovary were completely excised and hemostasis improved.   The vaginal cuff was grasped with kocher clamps. Angle sutures of 0 vicryl were placed at the apex of the vaginal cuff  Bilaterally. The cuff was closed with a running locked suture of 0 vicryl. . There was a small extension of the posterior vaginal cuff that was re approximated with 2-0 vicryl.   Attention was turned to the left Adnexa. The left ureter was identified. The left infundibulopelvic ligament was clamped with Heaney clamp. The left fallopian tube and ovary were excised. The Left infundibulopelvic ligament was ligated with 0 vicryl.  Both ureters were identified and found to peristals .Marland KitchenMarland KitchenThe abdomen was copiously irrigated. There was slight oozing from the vaginal cuff. Surgiflo was placed. Excellent hemostasis was noted. All laps and instruments were removed  from the abdomen. The fascia was closed with 0 vicryl. The subcutaneous layer was re approximated with 2-0 vicryl. The skin was closed with 4-0 vicryl.  The fascia of the supraumbilical trocar site was closed with 0 vicryl. The skin of the trocar sites were closed with 4-0 vicryl.   The sponge count was correct times two.  However the instrument count was uncertain therefore an intraoperative xray was obtained and negative for any retained foreign object. The patient was then awakened from anesthesia and taken to the recovery room awake and in stable condition

## 2015-01-01 NOTE — H&P (Signed)
Date of Initial H&P: 12/31/2014 History reviewed, patient examined, no change in status, stable for surgery.

## 2015-01-02 ENCOUNTER — Encounter (HOSPITAL_COMMUNITY): Payer: Self-pay | Admitting: Obstetrics and Gynecology

## 2015-01-02 LAB — CBC
HEMATOCRIT: 32.8 % — AB (ref 36.0–46.0)
HEMOGLOBIN: 11.3 g/dL — AB (ref 12.0–15.0)
MCH: 30.5 pg (ref 26.0–34.0)
MCHC: 34.5 g/dL (ref 30.0–36.0)
MCV: 88.4 fL (ref 78.0–100.0)
Platelets: 161 10*3/uL (ref 150–400)
RBC: 3.71 MIL/uL — ABNORMAL LOW (ref 3.87–5.11)
RDW: 14.4 % (ref 11.5–15.5)
WBC: 13 10*3/uL — ABNORMAL HIGH (ref 4.0–10.5)

## 2015-01-02 LAB — BASIC METABOLIC PANEL
Anion gap: 5 (ref 5–15)
BUN: 8 mg/dL (ref 6–23)
CO2: 23 mmol/L (ref 19–32)
CREATININE: 0.72 mg/dL (ref 0.50–1.10)
Calcium: 7.4 mg/dL — ABNORMAL LOW (ref 8.4–10.5)
Chloride: 107 mmol/L (ref 96–112)
GFR calc Af Amer: >90 mL/min (ref >90)
GFR calc non Af Amer: >90 mL/min (ref >90)
Glucose, Bld: 127 mg/dL — ABNORMAL HIGH (ref 70–99)
Potassium: 4 mmol/L (ref 3.5–5.1)
Sodium: 135 mmol/L (ref 135–145)

## 2015-01-02 MED ORDER — HYDROMORPHONE HCL 2 MG PO TABS
2.0000 mg | ORAL_TABLET | ORAL | Status: DC | PRN
  Administered 2015-01-02: 2 mg via ORAL
  Filled 2015-01-02: qty 1

## 2015-01-02 MED ORDER — PANTOPRAZOLE SODIUM 40 MG PO TBEC
40.0000 mg | DELAYED_RELEASE_TABLET | Freq: Every day | ORAL | Status: DC
  Administered 2015-01-02: 40 mg via ORAL
  Filled 2015-01-02: qty 1

## 2015-01-02 MED ORDER — IBUPROFEN 800 MG PO TABS
800.0000 mg | ORAL_TABLET | Freq: Three times a day (TID) | ORAL | Status: DC | PRN
  Administered 2015-01-02 – 2015-01-03 (×3): 800 mg via ORAL
  Filled 2015-01-02 (×3): qty 1

## 2015-01-02 NOTE — Anesthesia Postprocedure Evaluation (Signed)
Anesthesia Post Note  Patient: Alicia Sweeney  Procedure(s) Performed: Procedure(s): ATTEMPED LAPAROSCOPIC  ASSISTED TOTAL VAGINAL HYSTERECTOMY WITH CONVERSTION TO ABDOMINAL HYSTERECTOMY WITH BILATERAL SALPINGO OOPHORECTOMY (Bilateral)  Anesthesia type: General  Patient location: [facility]  Post pain: Pain level controlled  Post assessment: Post-op Vital signs reviewed  Last Vitals: BP 98/63 mmHg  Pulse 73  Temp(Src) 37.3 C (Oral)  Resp 14  SpO2 98%  LMP   Post vital signs: Reviewed  Level of consciousness: awake, alert and states she is doing well.Oxygen by nasal cannula.  Complications: No apparent anesthesia complications

## 2015-01-02 NOTE — Progress Notes (Signed)
1 Day Post-Op Procedure(s) (LRB): ATTEMPED LAPAROSCOPIC  ASSISTED TOTAL VAGINAL HYSTERECTOMY WITH CONVERSTION TO ABDOMINAL HYSTERECTOMY WITH BILATERAL SALPINGO OOPHORECTOMY (Bilateral)  Subjective: Patient reports tolerating PO and no problems voiding.  Pain is well controlled with motrin... She only has pain when she tries to get up. She has ambulated in the halls and voided.   Objective: I have reviewed patient's vital signs, intake and output, medications and labs.  General: alert and cooperative Resp: clear to auscultation bilaterally Cardio: regular rate and rhythm, S1, S2 normal, no murmur, click, rub or gallop GI: incision: clean, dry, intact and  abdomen is soft appropriately tender nondistended + BS in all 4 quadrants  Extremities: extremities normal, atraumatic, no cyanosis or edema   Recent Results (from the past 2160 hour(s))  Cytology - PAP     Status: None   Collection Time: 10/07/14 12:00 AM  Result Value Ref Range   CYTOLOGY - PAP PAP RESULT   CBC     Status: Abnormal   Collection Time: 12/25/14 11:05 AM  Result Value Ref Range   WBC 4.6 4.0 - 10.5 K/uL   RBC 3.89 3.87 - 5.11 MIL/uL   Hemoglobin 11.8 (L) 12.0 - 15.0 g/dL   HCT 79.2 13.0 - 39.7 %   MCV 93.3 78.0 - 100.0 fL   MCH 30.3 26.0 - 34.0 pg   MCHC 32.5 30.0 - 36.0 g/dL   RDW 49.1 51.9 - 04.6 %   Platelets 314 150 - 400 K/uL  Type and screen     Status: None (Preliminary result)   Collection Time: 12/25/14 11:05 AM  Result Value Ref Range   ABO/RH(D) A POS    Antibody Screen NEG    Sample Expiration 01/08/2015    Unit Number [id number]    Blood Component Type RED CELLS,LR    Unit division 00    Status of Unit ISSUED    Transfusion Status OK TO TRANSFUSE    Crossmatch Result Compatible    Unit Number [id number]    Blood Component Type RED CELLS,LR    Unit division 00    Status of Unit ISSUED    Transfusion Status OK TO TRANSFUSE    Crossmatch Result Compatible    Unit Number [id number]     Blood Component Type RED CELLS,LR    Unit division 00    Status of Unit ALLOCATED    Transfusion Status OK TO TRANSFUSE    Crossmatch Result Compatible    Unit Number [id number]    Blood Component Type RED CELLS,LR    Unit division 00    Status of Unit ISSUED    Transfusion Status OK TO TRANSFUSE    Crossmatch Result Compatible   ABO/Rh     Status: None   Collection Time: 12/25/14 11:05 AM  Result Value Ref Range   ABO/RH(D) A POS Performed at [facility]    Pregnancy, urine     Status: None   Collection Time: 01/01/15 11:00 AM  Result Value Ref Range   Preg Test, Ur NEGATIVE NEGATIVE    Comment:        THE SENSITIVITY OF THIS METHODOLOGY IS >20 mIU/mL.   Prepare RBC (crossmatch)     Status: None   Collection Time: 01/01/15  3:15 PM  Result Value Ref Range   Order Confirmation ORDER PROCESSED BY BLOOD BANK   Prepare RBC (crossmatch)     Status: None   Collection Time: 01/01/15  3:47 PM  Result Value Ref Range  Order Confirmation ORDER PROCESSED BY BLOOD BANK   CBC     Status: Abnormal   Collection Time: 01/01/15  6:29 PM  Result Value Ref Range   WBC 22.8 (H) 4.0 - 10.5 K/uL   RBC 4.36 3.87 - 5.11 MIL/uL   Hemoglobin 13.5 12.0 - 15.0 g/dL   HCT 39.5 36.0 - 46.0 %   MCV 90.6 78.0 - 100.0 fL   MCH 31.0 26.0 - 34.0 pg   MCHC 34.2 30.0 - 36.0 g/dL   RDW 13.8 11.5 - 15.5 %   Platelets 203 150 - 400 K/uL  Basic metabolic panel     Status: Abnormal   Collection Time: 01/02/15  5:30 AM  Result Value Ref Range   Sodium 135 135 - 145 mmol/L   Potassium 4.0 3.5 - 5.1 mmol/L   Chloride 107 96 - 112 mmol/L   CO2 23 19 - 32 mmol/L   Glucose, Bld 127 (H) 70 - 99 mg/dL   BUN 8 6 - 23 mg/dL   Creatinine, Ser 0.72 0.50 - 1.10 mg/dL   Calcium 7.4 (L) 8.4 - 10.5 mg/dL   GFR calc non Af Amer >90 >90 mL/min   GFR calc Af Amer >90 >90 mL/min    Comment: (NOTE) The eGFR has been calculated using the CKD EPI equation. This calculation has not been validated in all  clinical situations. eGFR's persistently <90 mL/min signify possible Chronic Kidney Disease.    Anion gap 5 5 - 15  CBC     Status: Abnormal   Collection Time: 01/02/15  5:30 AM  Result Value Ref Range   WBC 13.0 (H) 4.0 - 10.5 K/uL   RBC 3.71 (L) 3.87 - 5.11 MIL/uL   Hemoglobin 11.3 (L) 12.0 - 15.0 g/dL   HCT 32.8 (L) 36.0 - 46.0 %   MCV 88.4 78.0 - 100.0 fL   MCH 30.5 26.0 - 34.0 pg   MCHC 34.5 30.0 - 36.0 g/dL   RDW 14.4 11.5 - 15.5 %   Platelets 161 150 - 400 K/uL   Assessment: s/p Procedure(s): ATTEMPED LAPAROSCOPIC  ASSISTED TOTAL VAGINAL HYSTERECTOMY WITH CONVERSTION TO ABDOMINAL HYSTERECTOMY WITH BILATERAL SALPINGO OOPHORECTOMY (Bilateral): stable Operative hemorrhage pt s/p 3 units pRBC - Hemoglobin is stable at 11.3 this am    Plan: Advance diet Encourage ambulation Awaiting return of bowel function.. probable discharge home tomorrow   LOS: 1 day    Zyaire Dumas J. 01/02/2015, 11:13 AM

## 2015-01-02 NOTE — Progress Notes (Signed)
1 Day Post-Op Procedure(s) (LRB): ATTEMPED LAPAROSCOPIC  ASSISTED TOTAL VAGINAL HYSTERECTOMY WITH CONVERSTION TO ABDOMINAL HYSTERECTOMY WITH BILATERAL SALPINGO OOPHORECTOMY (Bilateral)  Subjective: Patient reports she is doing well.  "I'm ready to go home."  Pt has ambulated in halls once.  Pain well controlled.  States she has not used Dilaudid PCA much.  Denies flatus but tolerated clears overnight.    Objective: I have reviewed patient's vital signs, intake and output and labs.  General: alert, cooperative and no distress GI: normal findings: soft, non-tender and Dressings minimally soiled. and abnormal findings:  distended Extremities: SCDs in place Active bowel sounds. Urine clear.  HG 11.3 s/p EBL 1900, PRBCs 3 units  Assessment: s/p Procedure(s): ATTEMPED LAPAROSCOPIC  ASSISTED TOTAL VAGINAL HYSTERECTOMY WITH CONVERSTION TO ABDOMINAL HYSTERECTOMY WITH BILATERAL SALPINGO OOPHORECTOMY (Bilateral): stable Hemorrhage, S/p Blood transfusion. Pt doing very well.  Plan: Routine post op care. Advance diet Encourage ambulation Advance to PO medication Discontinue IV fluids  Saline lock IV.  Dr. Landry Mellow to see pt later this am.  LOS: 1 day    Magic Mohler 01/02/2015, 7:22 AM

## 2015-01-02 NOTE — Progress Notes (Signed)
The patient is receiving protonix by the intravenous route.  Based on criteria approved by the Pharmacy and [place], the medication is being converted to the equivalent oral dose form.  These criteria include: -No Active GI bleeding -Able to tolerate diet of full liquids (or better) or tube feeding -Able to tolerate other medications by the oral or enteral route  If you have any questions about this conversion, please contact the Pharmacy Department (ext [phone]).  Thank you.  Beryle Lathe  01/02/2015

## 2015-01-02 NOTE — Addendum Note (Signed)
Addendum  created 01/02/15 0810 by Flossie Dibble, CRNA   Modules edited: Notes Section   Notes Section:  File: [id number]

## 2015-01-03 LAB — CBC
HCT: 30.8 % — ABNORMAL LOW (ref 36.0–46.0)
Hemoglobin: 10.3 g/dL — ABNORMAL LOW (ref 12.0–15.0)
MCH: 30.2 pg (ref 26.0–34.0)
MCHC: 33.4 g/dL (ref 30.0–36.0)
MCV: 90.3 fL (ref 78.0–100.0)
PLATELETS: 164 10*3/uL (ref 150–400)
RBC: 3.41 MIL/uL — AB (ref 3.87–5.11)
RDW: 14.5 % (ref 11.5–15.5)
WBC: 9.3 10*3/uL (ref 4.0–10.5)

## 2015-01-03 MED ORDER — BISACODYL 10 MG RE SUPP
10.0000 mg | Freq: Once | RECTAL | Status: AC
  Administered 2015-01-03: 10 mg via RECTAL
  Filled 2015-01-03: qty 1

## 2015-01-03 MED ORDER — HYDROMORPHONE HCL 2 MG PO TABS: 2.0000 mg | ORAL_TABLET | ORAL | Status: DC | PRN

## 2015-01-03 MED ORDER — IBUPROFEN 800 MG PO TABS: 800.0000 mg | ORAL_TABLET | Freq: Three times a day (TID) | ORAL | Status: DC | PRN

## 2015-01-03 NOTE — Progress Notes (Signed)
Pt verbalizes understanding of d/c instructions, medications, follow up appts, when to seek medical attention and belongings policy. IV removed by NT. Pt d/c to main entrance, accompanied by NT and her daughter. Pt has prescription and copy of d/c instructions. Marry Guan

## 2015-01-03 NOTE — Discharge Summary (Signed)
Physician Discharge Summary  Patient ID: Alicia Sweeney MRN: [id number] DOB/AGE: [birth year] 54 y.o.  Admit date: 01/01/2015 Discharge date: 01/03/2015  Admission Diagnoses: 1 Fibroids 2 Anemia 3 Abnormal Uterine bleeding   Discharge Diagnoses:  Active Problems:   S/P TAH-BSO (total abdominal hysterectomy and bilateral salpingo-oophorectomy)   Discharged Condition: stable  Hospital Course:  Pt was admitted after attempted LAVH with conversion to abdominal hysterectomy / BSO due to  Hemorrhage. Patient received 3 units of packed red blood cells. POD #1 hgb 11.3. Pt had return of bowel function and bladder function prior to discharge   Consults: None  Significant Diagnostic Studies: labs: hgb 11.3 01/02/2015  Treatments: surgery: attempted lavh with conversion to Abdominal hysterectomy with bilateral salpingoophorectomy   Discharge Exam: Blood pressure 84/51, pulse 81, temperature 98.7 F (37.1 C), temperature source Oral, resp. rate 18, height 5\' 2"  (1.575 m), weight 72.122 kg (159 lb), SpO2 98 %. General appearance: alert and cooperative GI: soft appropriately tender nondistended bowel sounds present  Extremities: extremities normal, atraumatic, no cyanosis or edema  Disposition: Final discharge disposition not confirmed  Discharge Instructions     Remove dressing in 72 hours    Complete by:  As directed      Call MD for:  persistant nausea and vomiting    Complete by:  As directed      Call MD for:  redness, tenderness, or signs of infection (pain, swelling, redness, odor or green/yellow discharge around incision site)    Complete by:  As directed      Call MD for:  severe uncontrolled pain    Complete by:  As directed      Call MD for:  temperature >100.4    Complete by:  As directed      Diet general    Complete by:  As directed      Driving Restrictions    Complete by:  As directed   Avoid driving for 2 weeks     Increase activity slowly    Complete by:  As directed      Lifting restrictions    Complete by:  As directed   Avoid lifting over 10 lbs     May walk up steps    Complete by:  As directed      Sexual Activity Restrictions    Complete by:  As directed   Avoid sex            Medication List    TAKE these medications        HYDROmorphone 2 MG tablet  Commonly known as:  DILAUDID  Take 1 tablet (2 mg total) by mouth every 4 (four) hours as needed for severe pain.     ibuprofen 800 MG tablet  Commonly known as:  ADVIL,MOTRIN  Take 1 tablet (800 mg total) by mouth every 8 (eight) hours as needed for moderate pain.           Follow-up Information    Follow up with Catha Brow., MD In 2 weeks.   Specialty:  Obstetrics and Gynecology   Why:  pt should already have an appointment    Contact information:   [address] [place] [address] [phone]       Signed: Catha Brow. 01/03/2015, 7:57 AM

## 2015-01-03 NOTE — Progress Notes (Signed)
Pt received suppository at 0900 and has been ambulating in hallway to try to help pass flatus. Pt has still not passed any flatus. Hot beverage provided. Will continue to monitor. Marry Guan

## 2015-01-05 LAB — TYPE AND SCREEN
ABO/RH(D): A POS
ANTIBODY SCREEN: NEGATIVE
Unit division: 0
Unit division: 0
Unit division: 0
Unit division: 0

## 2015-10-05 HISTORY — PX: BREAST BIOPSY: SHX20

## 2015-10-09 ENCOUNTER — Other Ambulatory Visit: Payer: Self-pay

## 2015-10-09 DIAGNOSIS — Z1231 Encounter for screening mammogram for malignant neoplasm of breast: Secondary | ICD-10-CM

## 2015-10-10 ENCOUNTER — Ambulatory Visit
Admission: RE | Admit: 2015-10-10 | Discharge: 2015-10-10 | Disposition: A | Discharge status: Home / Self Care | Payer: BC Managed Care – PPO | Source: Ambulatory Visit

## 2015-10-10 DIAGNOSIS — Z1231 Encounter for screening mammogram for malignant neoplasm of breast: Secondary | ICD-10-CM

## 2015-10-13 ENCOUNTER — Other Ambulatory Visit: Payer: Self-pay | Admitting: Obstetrics and Gynecology

## 2015-10-13 DIAGNOSIS — R928 Other abnormal and inconclusive findings on diagnostic imaging of breast: Secondary | ICD-10-CM

## 2015-10-14 ENCOUNTER — Other Ambulatory Visit: Payer: Self-pay | Admitting: Obstetrics and Gynecology

## 2015-10-14 DIAGNOSIS — R928 Other abnormal and inconclusive findings on diagnostic imaging of breast: Secondary | ICD-10-CM

## 2015-10-17 ENCOUNTER — Ambulatory Visit
Admission: RE | Admit: 2015-10-17 | Discharge: 2015-10-17 | Disposition: A | Discharge status: Home / Self Care | Payer: BC Managed Care – PPO | Source: Ambulatory Visit | Attending: Obstetrics and Gynecology | Admitting: Obstetrics and Gynecology

## 2015-10-17 ENCOUNTER — Other Ambulatory Visit: Payer: Self-pay | Admitting: Obstetrics and Gynecology

## 2015-10-17 DIAGNOSIS — R928 Other abnormal and inconclusive findings on diagnostic imaging of breast: Secondary | ICD-10-CM

## 2015-10-17 DIAGNOSIS — N6320 Unspecified lump in the left breast, unspecified quadrant: Secondary | ICD-10-CM

## 2015-10-21 ENCOUNTER — Ambulatory Visit
Admission: RE | Admit: 2015-10-21 | Discharge: 2015-10-21 | Disposition: A | Discharge status: Home / Self Care | Payer: BC Managed Care – PPO | Source: Ambulatory Visit | Attending: Obstetrics and Gynecology | Admitting: Obstetrics and Gynecology

## 2015-10-21 ENCOUNTER — Other Ambulatory Visit: Payer: Self-pay | Admitting: Obstetrics and Gynecology

## 2015-10-21 DIAGNOSIS — N6320 Unspecified lump in the left breast, unspecified quadrant: Secondary | ICD-10-CM

## 2017-10-21 ENCOUNTER — Ambulatory Visit
Admission: RE | Admit: 2017-10-21 | Discharge: 2017-10-21 | Disposition: A | Discharge status: Home / Self Care | Payer: BC Managed Care – PPO | Source: Ambulatory Visit | Attending: Obstetrics and Gynecology | Admitting: Obstetrics and Gynecology

## 2017-10-21 ENCOUNTER — Other Ambulatory Visit: Payer: Self-pay | Admitting: Obstetrics and Gynecology

## 2017-10-21 DIAGNOSIS — Z1231 Encounter for screening mammogram for malignant neoplasm of breast: Secondary | ICD-10-CM

## 2018-10-11 ENCOUNTER — Other Ambulatory Visit: Payer: Self-pay | Admitting: Physician Assistant

## 2018-10-11 DIAGNOSIS — Z1231 Encounter for screening mammogram for malignant neoplasm of breast: Secondary | ICD-10-CM

## 2018-11-09 ENCOUNTER — Ambulatory Visit: Payer: Self-pay

## 2018-11-09 ENCOUNTER — Ambulatory Visit
Admission: RE | Admit: 2018-11-09 | Discharge: 2018-11-09 | Disposition: A | Discharge status: Home / Self Care | Payer: BC Managed Care – PPO | Source: Ambulatory Visit | Attending: Physician Assistant | Admitting: Physician Assistant

## 2018-11-09 DIAGNOSIS — Z1231 Encounter for screening mammogram for malignant neoplasm of breast: Secondary | ICD-10-CM

## 2018-11-10 ENCOUNTER — Other Ambulatory Visit: Payer: Self-pay | Admitting: Physician Assistant

## 2018-11-10 DIAGNOSIS — R928 Other abnormal and inconclusive findings on diagnostic imaging of breast: Secondary | ICD-10-CM

## 2018-11-17 ENCOUNTER — Ambulatory Visit
Admission: RE | Admit: 2018-11-17 | Discharge: 2018-11-17 | Disposition: A | Discharge status: Home / Self Care | Payer: BC Managed Care – PPO | Source: Ambulatory Visit | Attending: Physician Assistant | Admitting: Physician Assistant

## 2018-11-17 ENCOUNTER — Ambulatory Visit: Payer: BC Managed Care – PPO

## 2018-11-17 DIAGNOSIS — R928 Other abnormal and inconclusive findings on diagnostic imaging of breast: Secondary | ICD-10-CM

## 2019-02-27 IMAGING — MG DIGITAL DIAGNOSTIC UNILATERAL RIGHT MAMMOGRAM WITH TOMO AND CAD
8 series · 9 of 24 positions shown · non-contrast
Comparison: Previous exams including recent screening mammogram
dated 11/09/2018.

CLINICAL DATA: Patient returns today to evaluate a possible RIGHT
breast asymmetry questioned on recent screening mammogram.

EXAM:
DIGITAL DIAGNOSTIC UNILATERAL RIGHT MAMMOGRAM WITH CAD AND TOMO

[R CC synth-2D (1 of 2)]
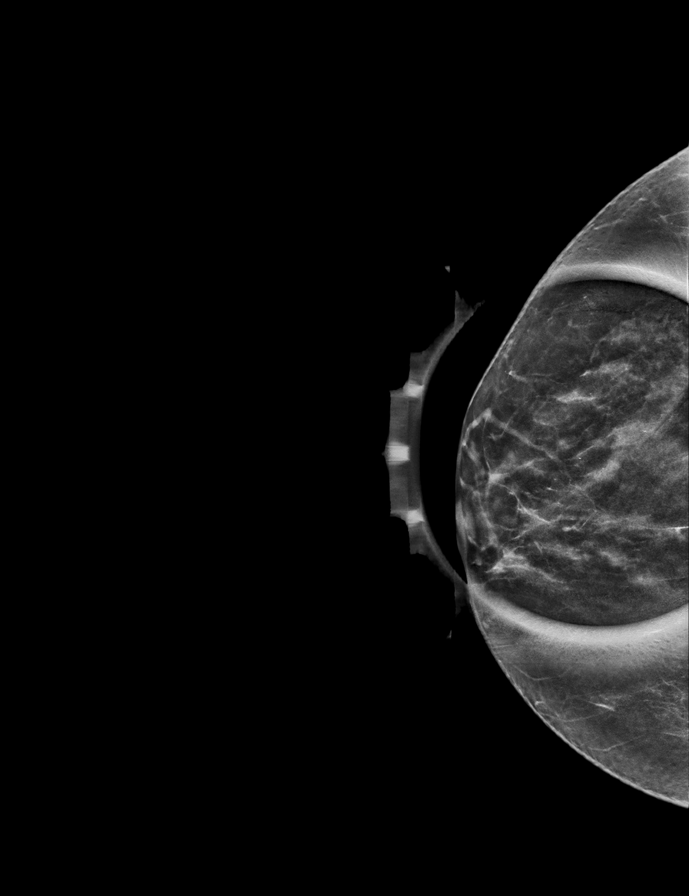

[R MLO synth-2D]
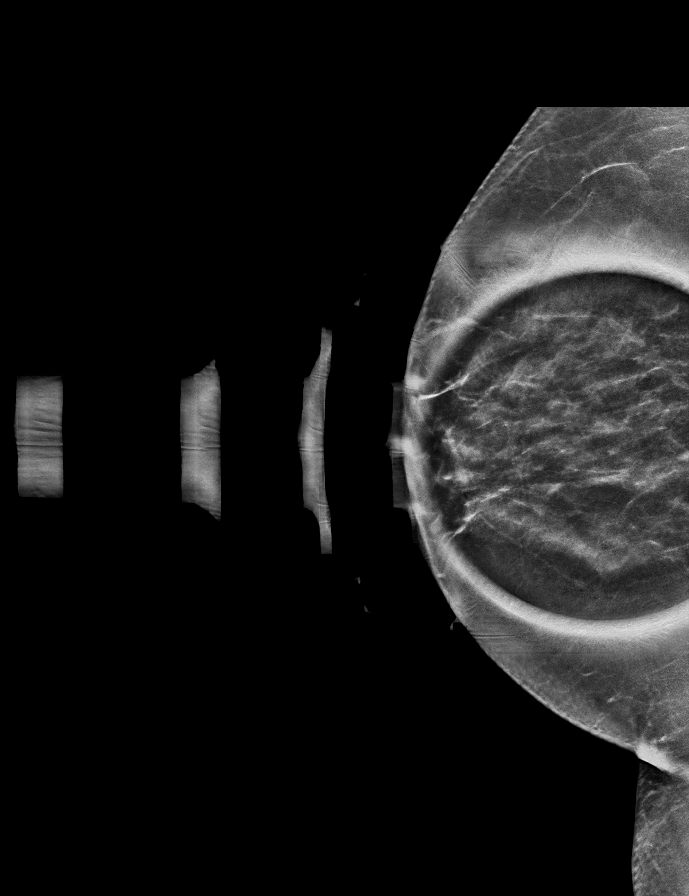

[R CC synth-2D (2 of 2)]
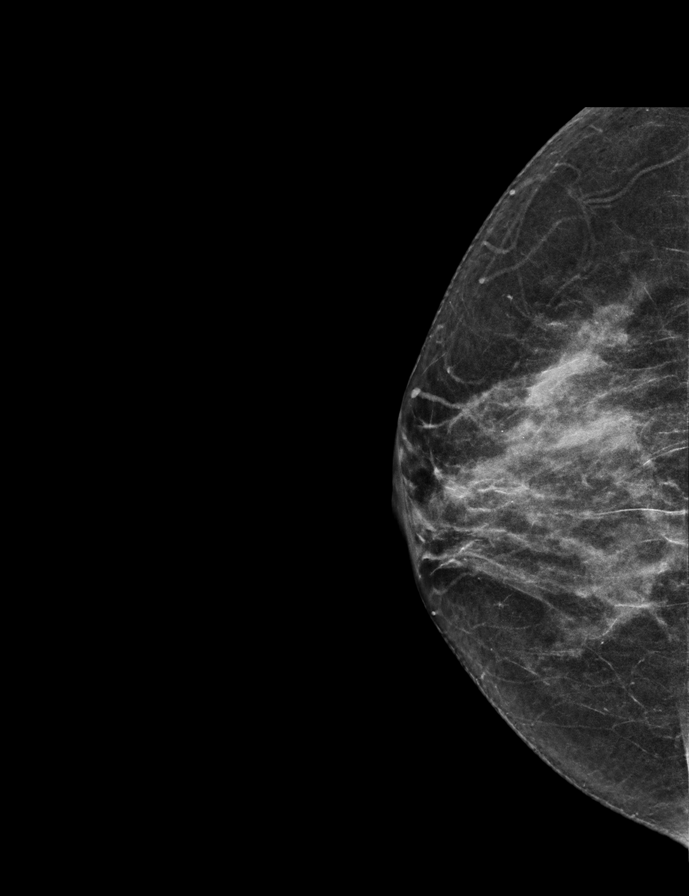

[R ML synth-2D]
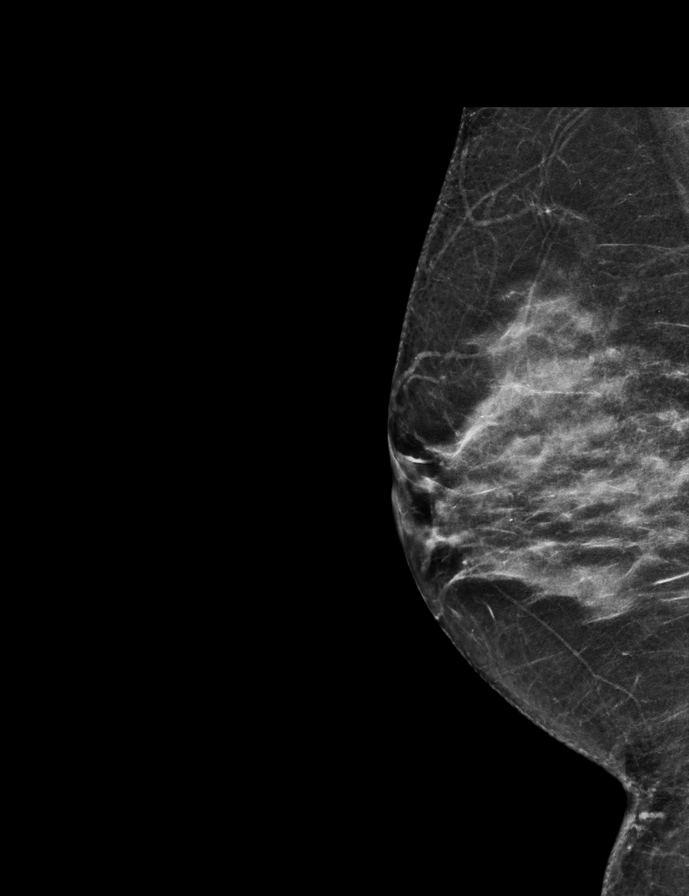

[R MLO tomo · 2 of 53 frames shown]
[frame 18/53]
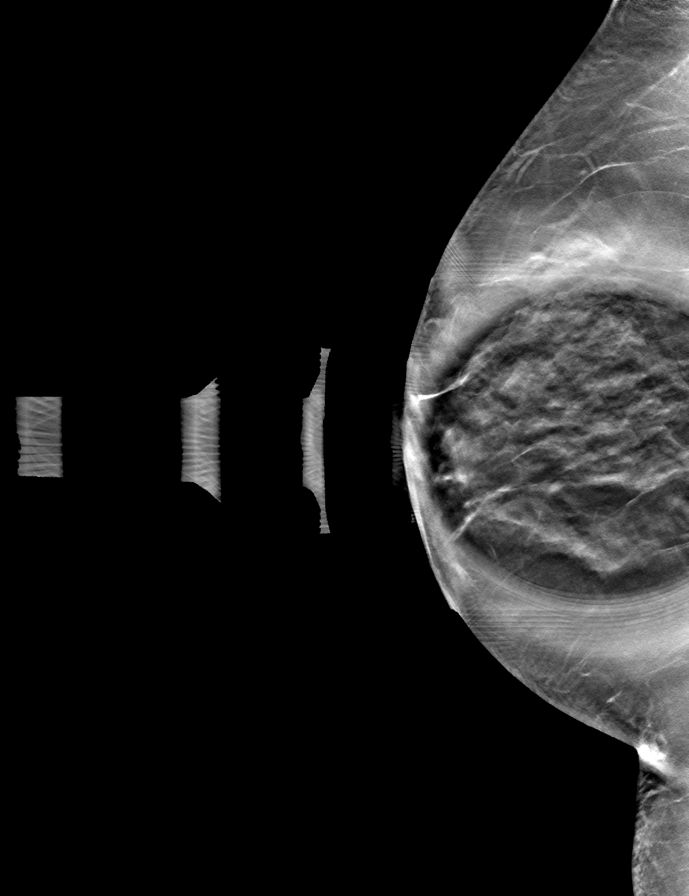
[frame 27/53]
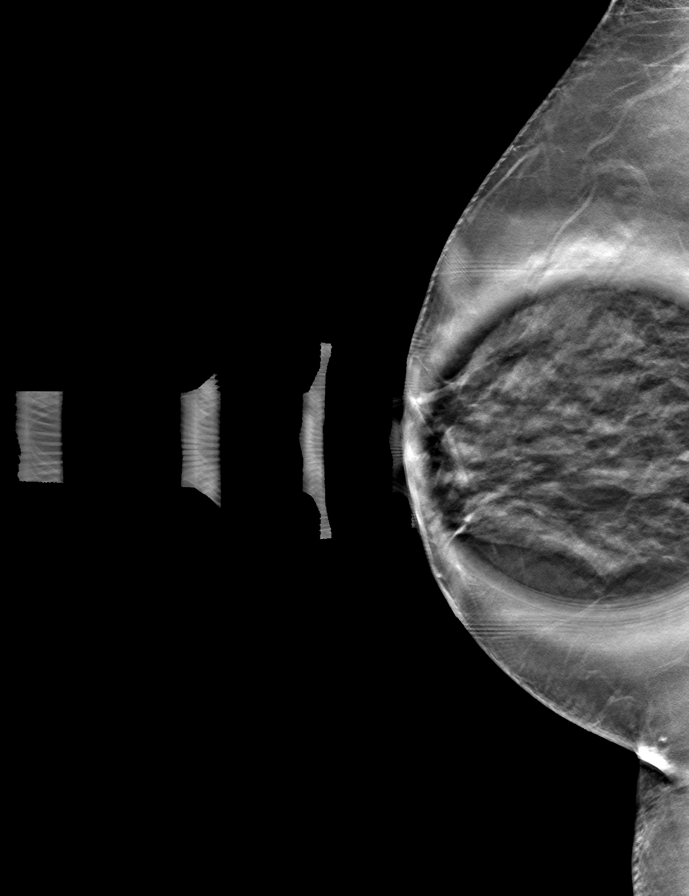

[R CC tomo (1 of 2) · tomo slice 27/52.0]
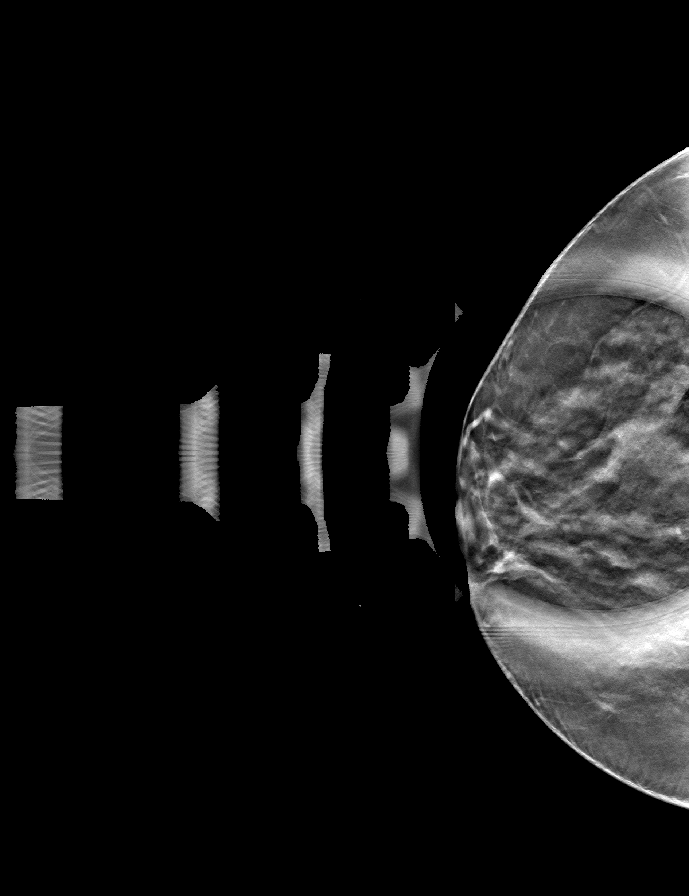

[R ML tomo · tomo slice 29/57.0]
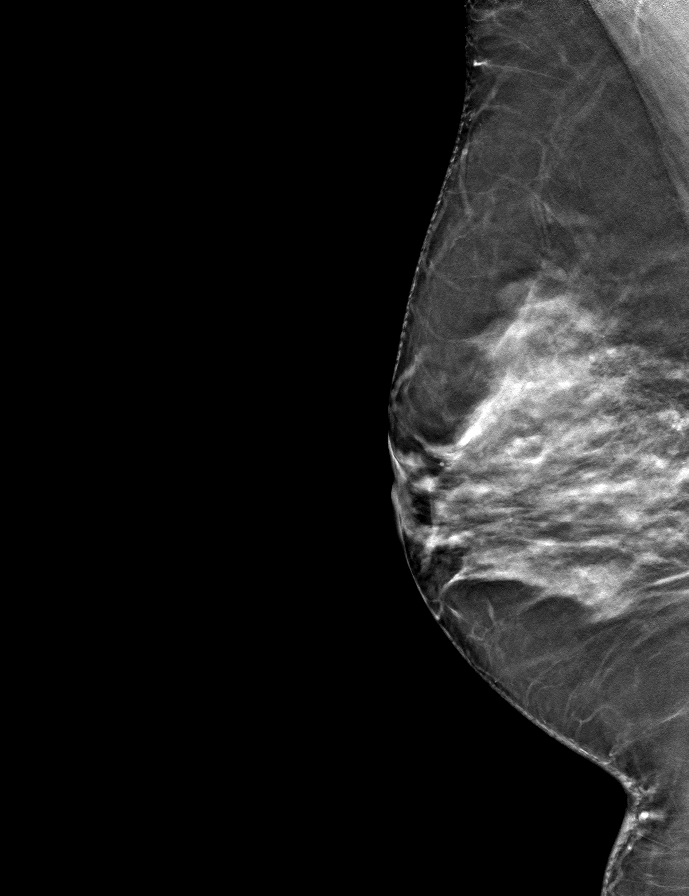

[R CC tomo (2 of 2) · tomo slice 29/57.0]
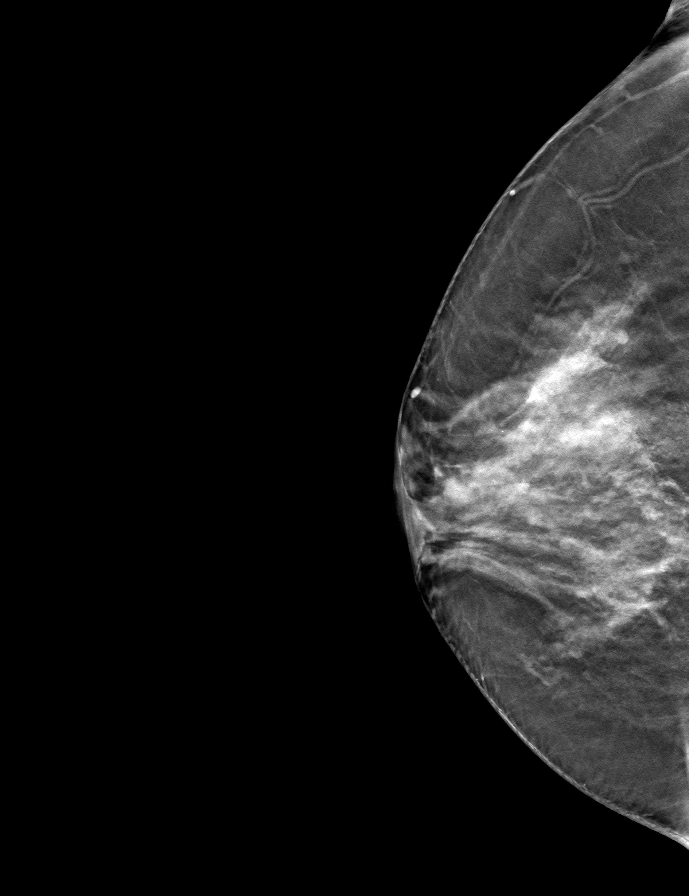

[9 of 24 positions shown; findings below may reference images not displayed]

ACR Breast Density Category c: The breast tissue is heterogeneously
dense, which may obscure small masses.
FINDINGS: On today's additional diagnostic views, including spot compression
with 3D tomosynthesis, there is no persistent asymmetry within the
RIGHT breast indicating superimposition of normal dense
fibroglandular tissues.

Mammographic images were processed with CAD.
IMPRESSION: No evidence of malignancy. Patient may return to routine annual
bilateral screening mammogram schedule.

RECOMMENDATION:
Screening mammogram in one year.(Code:[id number])

I have discussed the findings and recommendations with the patient.
Results were also provided in writing at the conclusion of the
visit. If applicable, a reminder letter will be sent to the patient
regarding the next appointment.

BI-RADS CATEGORY  1: Negative.

## 2019-06-01 ENCOUNTER — Ambulatory Visit: Payer: BC Managed Care – PPO | Admitting: Cardiology

## 2019-06-01 ENCOUNTER — Other Ambulatory Visit: Payer: Self-pay

## 2019-06-01 ENCOUNTER — Encounter: Payer: Self-pay | Admitting: Cardiology

## 2019-06-01 VITALS — BP 123/87 | HR 92 | Temp 97.7°F | Ht 62.0 in | Wt 171.0 lb

## 2019-06-01 DIAGNOSIS — I10 Essential (primary) hypertension: Secondary | ICD-10-CM | POA: Diagnosis not present

## 2019-06-01 DIAGNOSIS — I313 Pericardial effusion (noninflammatory): Secondary | ICD-10-CM | POA: Diagnosis not present

## 2019-06-01 DIAGNOSIS — Z8616 Personal history of COVID-19: Secondary | ICD-10-CM

## 2019-06-01 DIAGNOSIS — Z8619 Personal history of other infectious and parasitic diseases: Secondary | ICD-10-CM

## 2019-06-01 DIAGNOSIS — I3139 Other pericardial effusion (noninflammatory): Secondary | ICD-10-CM

## 2019-06-01 NOTE — Progress Notes (Signed)
Patient referred by Lennie Odor, PA-C for pericardial effusion.  Subjective:   Alicia Sweeney, female    DOB: 24-Feb-1961, 58 y.o.   MRN: [id number]   Chief Complaint  Patient presents with  . Pericardial Effusion  . New Patient (Initial Visit)     HPI  58 y.o. African American female, [place] survivor, recent pericardial effusion, thrombophilia.  Patient works at [employer] in [place]. She previously lived in [place], and thus seeks her medical care here. Patient was admitted to [facility] in [place], [place] with Olla in July 2020. She likely contracted the infection from her parent who unfortunately passed. Patient was on ventilator and admitted for 26 days. I do not have records of this hospitalization. However, per patient history and PCP notes, it appears that she likely had thrombophilia, pericardial effusion. Patient was on Xarelto during and immediately post hospitalization. She is now off it.   She has been recovering from [place]. She still has residual fatigue and mild shortness of breath, denies chest pain. She is hoping to get back to work, perhaps from home, in October 2020.   Past Medical History:  Diagnosis Date  . Anemia   . History of blood transfusion 2010   In [facility]  . SVD (spontaneous vaginal delivery)    x 3     Past Surgical History:  Procedure Laterality Date  . BREAST BIOPSY Left 2017  . BREAST EXCISIONAL BIOPSY Left   . BREAST SURGERY     benign cyst removed left breast  . COLONOSCOPY    . ROBOTIC ASSISTED TOTAL HYSTERECTOMY WITH BILATERAL SALPINGO OOPHERECTOMY Bilateral 01/01/2015   Procedure: ATTEMPED LAPAROSCOPIC  ASSISTED TOTAL VAGINAL HYSTERECTOMY WITH CONVERSTION TO ABDOMINAL HYSTERECTOMY WITH BILATERAL SALPINGO OOPHORECTOMY;  Surgeon: Christophe Louis, MD;  Location: [facility];  Service: Gynecology;  Laterality: Bilateral;  . TUBAL LIGATION    . WISDOM TOOTH EXTRACTION       Social History   Socioeconomic  History  . Marital status: Divorced    Spouse name: Not on file  . Number of children: Not on file  . Years of education: Not on file  . Highest education level: Not on file  Occupational History  . Not on file  Social Needs  . Financial resource strain: Not on file  . Food insecurity    Worry: Not on file    Inability: Not on file  . Transportation needs    Medical: Not on file    Non-medical: Not on file  Tobacco Use  . Smoking status: Never Smoker  . Smokeless tobacco: Never Used  Substance and Sexual Activity  . Alcohol use: No  . Drug use: No  . Sexual activity: Yes    Birth control/protection: Surgical  Lifestyle  . Physical activity    Days per week: Not on file    Minutes per session: Not on file  . Stress: Not on file  Relationships  . Social Herbalist on phone: Not on file    Gets together: Not on file    Attends religious service: Not on file    Active member of club or organization: Not on file    Attends meetings of clubs or organizations: Not on file    Relationship status: Not on file  . Intimate partner violence    Fear of current or ex partner: Not on file    Emotionally abused: Not on file    Physically abused: Not on  file    Forced sexual activity: Not on file  Other Topics Concern  . Not on file  Social History Narrative  . Not on file     Family History  Problem Relation Age of Onset  . Hypertension Mother   . Heart disease Father   . Hypertension Father   . Diabetes Father   . Diabetes Sister   . Hypertension Sister   . Hypertension Sister   . Lupus Sister      Current Outpatient Medications on File Prior to Visit  Medication Sig Dispense Refill  . HYDROmorphone (DILAUDID) 2 MG tablet Take 1 tablet (2 mg total) by mouth every 4 (four) hours as needed for severe pain. 30 tablet 0  . ibuprofen (ADVIL,MOTRIN) 800 MG tablet Take 1 tablet (800 mg total) by mouth every 8 (eight) hours as needed for moderate pain. 30 tablet 1    No current facility-administered medications on file prior to visit.     Cardiovascular studies:  EKG 06/01/2019: Sinus rhythm 88 bpm.  Left ventricular hypertrophy.  Recent labs: 11/09/2018: Chol 157, TG 90, HDL 46, LDL 93.  Review of Systems  Constitution: Positive for malaise/fatigue. Negative for decreased appetite, weight gain and weight loss.  HENT: Negative for congestion.   Eyes: Negative for visual disturbance.  Cardiovascular: Positive for dyspnea on exertion. Negative for chest pain, leg swelling, palpitations and syncope.  Respiratory: Positive for shortness of breath. Negative for cough.   Endocrine: Negative for cold intolerance.  Hematologic/Lymphatic: Does not bruise/bleed easily.  Skin: Negative for itching and rash.  Musculoskeletal: Negative for myalgias.  Gastrointestinal: Negative for abdominal pain, nausea and vomiting.  Genitourinary: Negative for dysuria.  Neurological: Negative for dizziness and weakness.  Psychiatric/Behavioral: The patient is not nervous/anxious.   All other systems reviewed and are negative.       Vitals:   06/01/19 1459  BP: 123/87  Pulse: 92  Temp: 97.7 F (36.5 C)  SpO2: 96%     Body mass index is 31.28 kg/m. Filed Weights   06/01/19 1459  Weight: 171 lb (77.6 kg)     Objective:   Physical Exam  Constitutional: She is oriented to person, place, and time. She appears well-developed and well-nourished. No distress.  HENT:  Head: Normocephalic and atraumatic.  Eyes: Pupils are equal, round, and reactive to light. Conjunctivae are normal.  Neck: No JVD present.  Cardiovascular: Normal rate, regular rhythm and intact distal pulses.  Pulmonary/Chest: Effort normal and breath sounds normal. She has no wheezes. She has no rales.  Abdominal: Soft. Bowel sounds are normal. There is no rebound.  Musculoskeletal:        General: No edema.  Lymphadenopathy:    She has no cervical adenopathy.  Neurological: She is  alert and oriented to person, place, and time. No cranial nerve deficit.  Skin: Skin is warm and dry.  Psychiatric: She has a normal mood and affect.  Nursing note and vitals reviewed.         Assessment & Recommendations:   58 y.o. African American female, [place] survivor, recent pericardial effusion, thrombophilia.  Pericardial effusion: By physical exam, I do not think she has clinically significant pericardial effusion. I suspect this was likely related to her COVID. Will repeat echocardiogram in 4 weeks. Will obtain records from PCP re: [place] hospitalization.   Hypertension: Controlled  Thank you for referring the patient to [place]. Please feel free to contact with any questions.  Nigel Mormon, MD [facility]  Pager: [phone] Office: [phone] If no answer Cell [phone]   

## 2019-07-02 ENCOUNTER — Encounter: Payer: Self-pay | Admitting: Cardiology

## 2019-07-02 ENCOUNTER — Other Ambulatory Visit: Payer: Self-pay

## 2019-07-02 ENCOUNTER — Ambulatory Visit (INDEPENDENT_AMBULATORY_CARE_PROVIDER_SITE_OTHER): Payer: BC Managed Care – PPO

## 2019-07-02 ENCOUNTER — Ambulatory Visit
Admission: RE | Admit: 2019-07-02 | Discharge: 2019-07-02 | Disposition: A | Discharge status: Home / Self Care | Payer: BC Managed Care – PPO | Source: Ambulatory Visit | Attending: Physician Assistant | Admitting: Physician Assistant

## 2019-07-02 ENCOUNTER — Other Ambulatory Visit: Payer: Self-pay | Admitting: Physician Assistant

## 2019-07-02 ENCOUNTER — Ambulatory Visit (INDEPENDENT_AMBULATORY_CARE_PROVIDER_SITE_OTHER): Payer: BC Managed Care – PPO | Admitting: Cardiology

## 2019-07-02 VITALS — BP 126/78 | HR 78 | Temp 96.4°F | Ht 62.0 in | Wt 174.0 lb

## 2019-07-02 DIAGNOSIS — Z8616 Personal history of COVID-19: Secondary | ICD-10-CM

## 2019-07-02 DIAGNOSIS — I3139 Other pericardial effusion (noninflammatory): Secondary | ICD-10-CM

## 2019-07-02 DIAGNOSIS — I313 Pericardial effusion (noninflammatory): Secondary | ICD-10-CM | POA: Diagnosis not present

## 2019-07-02 DIAGNOSIS — I10 Essential (primary) hypertension: Secondary | ICD-10-CM | POA: Diagnosis not present

## 2019-07-02 DIAGNOSIS — Z8619 Personal history of other infectious and parasitic diseases: Secondary | ICD-10-CM

## 2019-07-02 NOTE — Progress Notes (Signed)
Patient referred by Lennie Odor, PA-C for pericardial effusion.  Subjective:   Alicia Sweeney, female    DOB: 1961-02-02, 58 y.o.   MRN: [id number]   Chief Complaint  Patient presents with  . Pericardial Effusion  . Follow-up     HPI  58 y.o. African American female, [place] survivor, recent pericardial effusion, thrombophilia.  Echocardiogram today showed trace pericardial effusion. Patient is recovering well after COVID. She denies chest pain, shortness of breath, palpitations, leg edema, orthopnea, PND, TIA/syncope.   Past Medical History:  Diagnosis Date  . Anemia   . History of blood transfusion 2010   In [facility]  . SVD (spontaneous vaginal delivery)    x 3     Past Surgical History:  Procedure Laterality Date  . BREAST BIOPSY Left 2017  . BREAST EXCISIONAL BIOPSY Left   . BREAST SURGERY     benign cyst removed left breast  . COLONOSCOPY    . ROBOTIC ASSISTED TOTAL HYSTERECTOMY WITH BILATERAL SALPINGO OOPHERECTOMY Bilateral 01/01/2015   Procedure: ATTEMPED LAPAROSCOPIC  ASSISTED TOTAL VAGINAL HYSTERECTOMY WITH CONVERSTION TO ABDOMINAL HYSTERECTOMY WITH BILATERAL SALPINGO OOPHORECTOMY;  Surgeon: Christophe Louis, MD;  Location: [facility];  Service: Gynecology;  Laterality: Bilateral;  . TUBAL LIGATION    . WISDOM TOOTH EXTRACTION       Social History   Socioeconomic History  . Marital status: Divorced    Spouse name: Not on file  . Number of children: 3  . Years of education: Not on file  . Highest education level: Not on file  Occupational History  . Not on file  Social Needs  . Financial resource strain: Not on file  . Food insecurity    Worry: Not on file    Inability: Not on file  . Transportation needs    Medical: Not on file    Non-medical: Not on file  Tobacco Use  . Smoking status: Never Smoker  . Smokeless tobacco: Never Used  Substance and Sexual Activity  . Alcohol use: No  . Drug use: No  . Sexual activity: Yes    Birth  control/protection: Surgical  Lifestyle  . Physical activity    Days per week: Not on file    Minutes per session: Not on file  . Stress: Not on file  Relationships  . Social Herbalist on phone: Not on file    Gets together: Not on file    Attends religious service: Not on file    Active member of club or organization: Not on file    Attends meetings of clubs or organizations: Not on file    Relationship status: Not on file  . Intimate partner violence    Fear of current or ex partner: Not on file    Emotionally abused: Not on file    Physically abused: Not on file    Forced sexual activity: Not on file  Other Topics Concern  . Not on file  Social History Narrative  . Not on file     Family History  Problem Relation Age of Onset  . Hypertension Mother   . Heart disease Father   . Hypertension Father   . Diabetes Father   . Diabetes Sister   . Hypertension Sister   . Hypertension Sister   . Lupus Sister      Current Outpatient Medications on File Prior to Visit  Medication Sig Dispense Refill  . ANORO ELLIPTA 62.5-25 MCG/INH AEPB 1 puff daily.    Marland Kitchen  aspirin EC 81 MG tablet Take 81 mg by mouth daily.    . benzonatate (TESSALON) 100 MG capsule Take 1 capsule by mouth 3 (three) times daily as needed.    . Cholecalciferol (VITAMIN D3) 125 MCG (5000 UT) CAPS Take 1 capsule by mouth daily.    . metoprolol tartrate (LOPRESSOR) 25 MG tablet Take 25 mg by mouth 2 (two) times daily.    . Multiple Vitamins-Minerals ([employer] SPECTRAVITE WOMENS SENIOR) TABS Take 1 tablet by mouth daily with breakfast.     No current facility-administered medications on file prior to visit.     Cardiovascular studies:  Echocardiogram 07/02/2019: Left ventricle cavity is normal in size. Moderate concentric hypertrophy of the left ventricle. Mildly depressed LV systolic function with EF 50%. Normal global wall motion. Doppler evidence of grade I (impaired) diastolic dysfunction, normal LAP.  Calculated EF 50%. Mild tricuspid regurgitation.  Trace pericardial effusion. No evidence of pulmonary hypertension.  EKG 06/01/2019: Sinus rhythm 88 bpm.  Left ventricular hypertrophy.  Recent labs: 11/09/2018: Chol 157, TG 90, HDL 46, LDL 93.  Review of Systems  Constitution: Positive for malaise/fatigue. Negative for decreased appetite, weight gain and weight loss.  HENT: Negative for congestion.   Eyes: Negative for visual disturbance.  Cardiovascular: Positive for dyspnea on exertion. Negative for chest pain, leg swelling, palpitations and syncope.  Respiratory: Positive for shortness of breath. Negative for cough.   Endocrine: Negative for cold intolerance.  Hematologic/Lymphatic: Does not bruise/bleed easily.  Skin: Negative for itching and rash.  Musculoskeletal: Negative for myalgias.  Gastrointestinal: Negative for abdominal pain, nausea and vomiting.  Genitourinary: Negative for dysuria.  Neurological: Negative for dizziness and weakness.  Psychiatric/Behavioral: The patient is not nervous/anxious.   All other systems reviewed and are negative.       Vitals:   07/02/19 1344  BP: 126/78  Pulse: 78  Temp: (!) 96.4 F (35.8 C)  SpO2: 95%     Body mass index is 31.83 kg/m. Filed Weights   07/02/19 1344  Weight: 174 lb (78.9 kg)     Objective:   Physical Exam  Constitutional: She is oriented to person, place, and time. She appears well-developed and well-nourished. No distress.  HENT:  Head: Normocephalic and atraumatic.  Eyes: Pupils are equal, round, and reactive to light. Conjunctivae are normal.  Neck: No JVD present.  Cardiovascular: Normal rate, regular rhythm and intact distal pulses.  Pulmonary/Chest: Effort normal and breath sounds normal. She has no wheezes. She has no rales.  Abdominal: Soft. Bowel sounds are normal. There is no rebound.  Musculoskeletal:        General: No edema.  Lymphadenopathy:    She has no cervical adenopathy.   Neurological: She is alert and oriented to person, place, and time. No cranial nerve deficit.  Skin: Skin is warm and dry.  Psychiatric: She has a normal mood and affect.  Nursing note and vitals reviewed.         Assessment & Recommendations:   58 y.o. African American female, [place] survivor, recent pericardial effusion, thrombophilia.  Pericardial effusion: Trace. No intervention necessary.  Hypertension: Controlled  No primary indication for Aspirin. Okay to stop.  I will see he ron as needed basis.   Nigel Mormon, MD [facility] [place] Pager: [phone] Office: [phone] If no answer Cell [phone]

## 2019-10-12 IMAGING — CR DG CHEST 2V
2 series · 2 of 2 positions shown · non-contrast
Comparison: None.

CLINICAL DATA: Shortness of breath.  Recent QLAP9-4C positive

EXAM:
CHEST - 2 VIEW

[w chest pa]
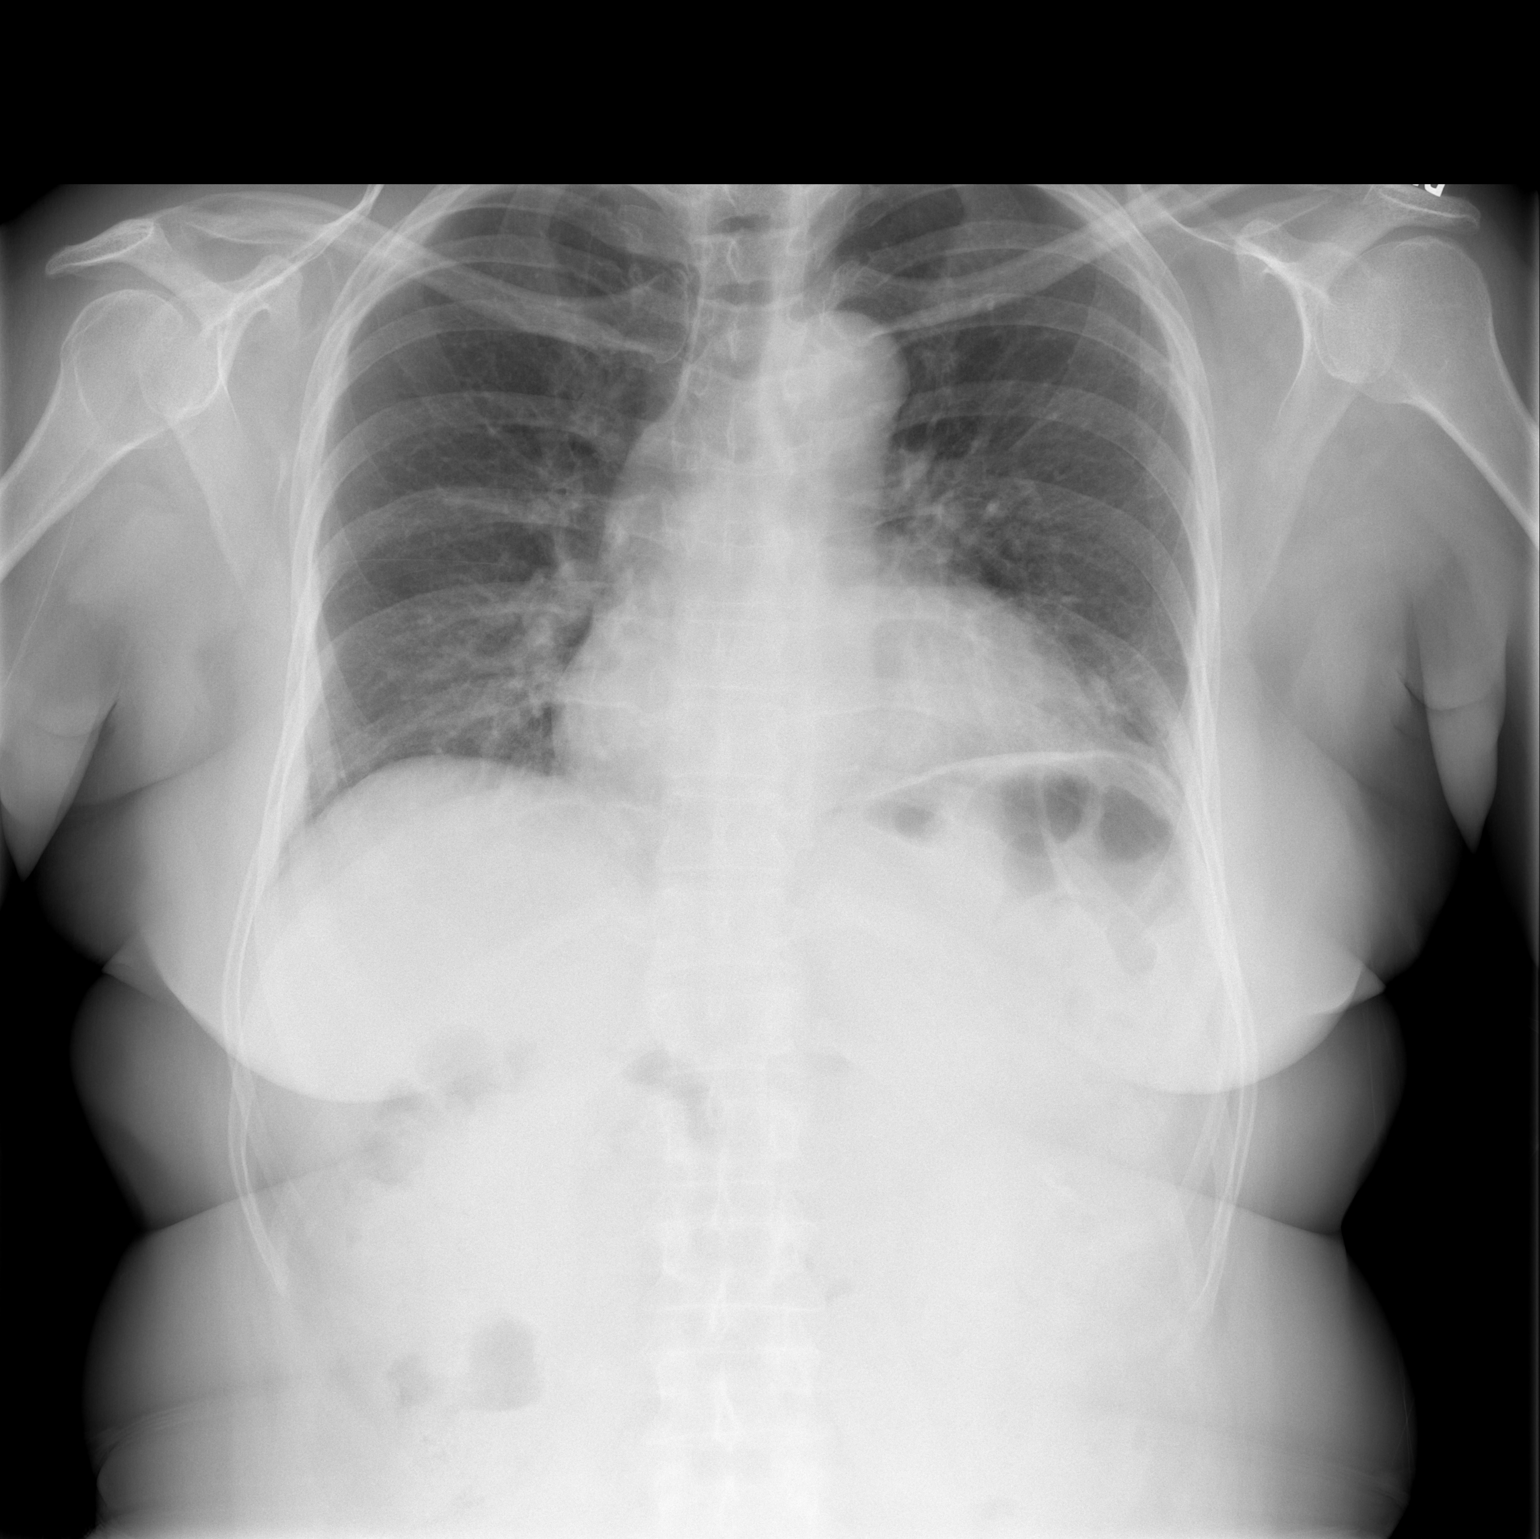

[w chest lat]
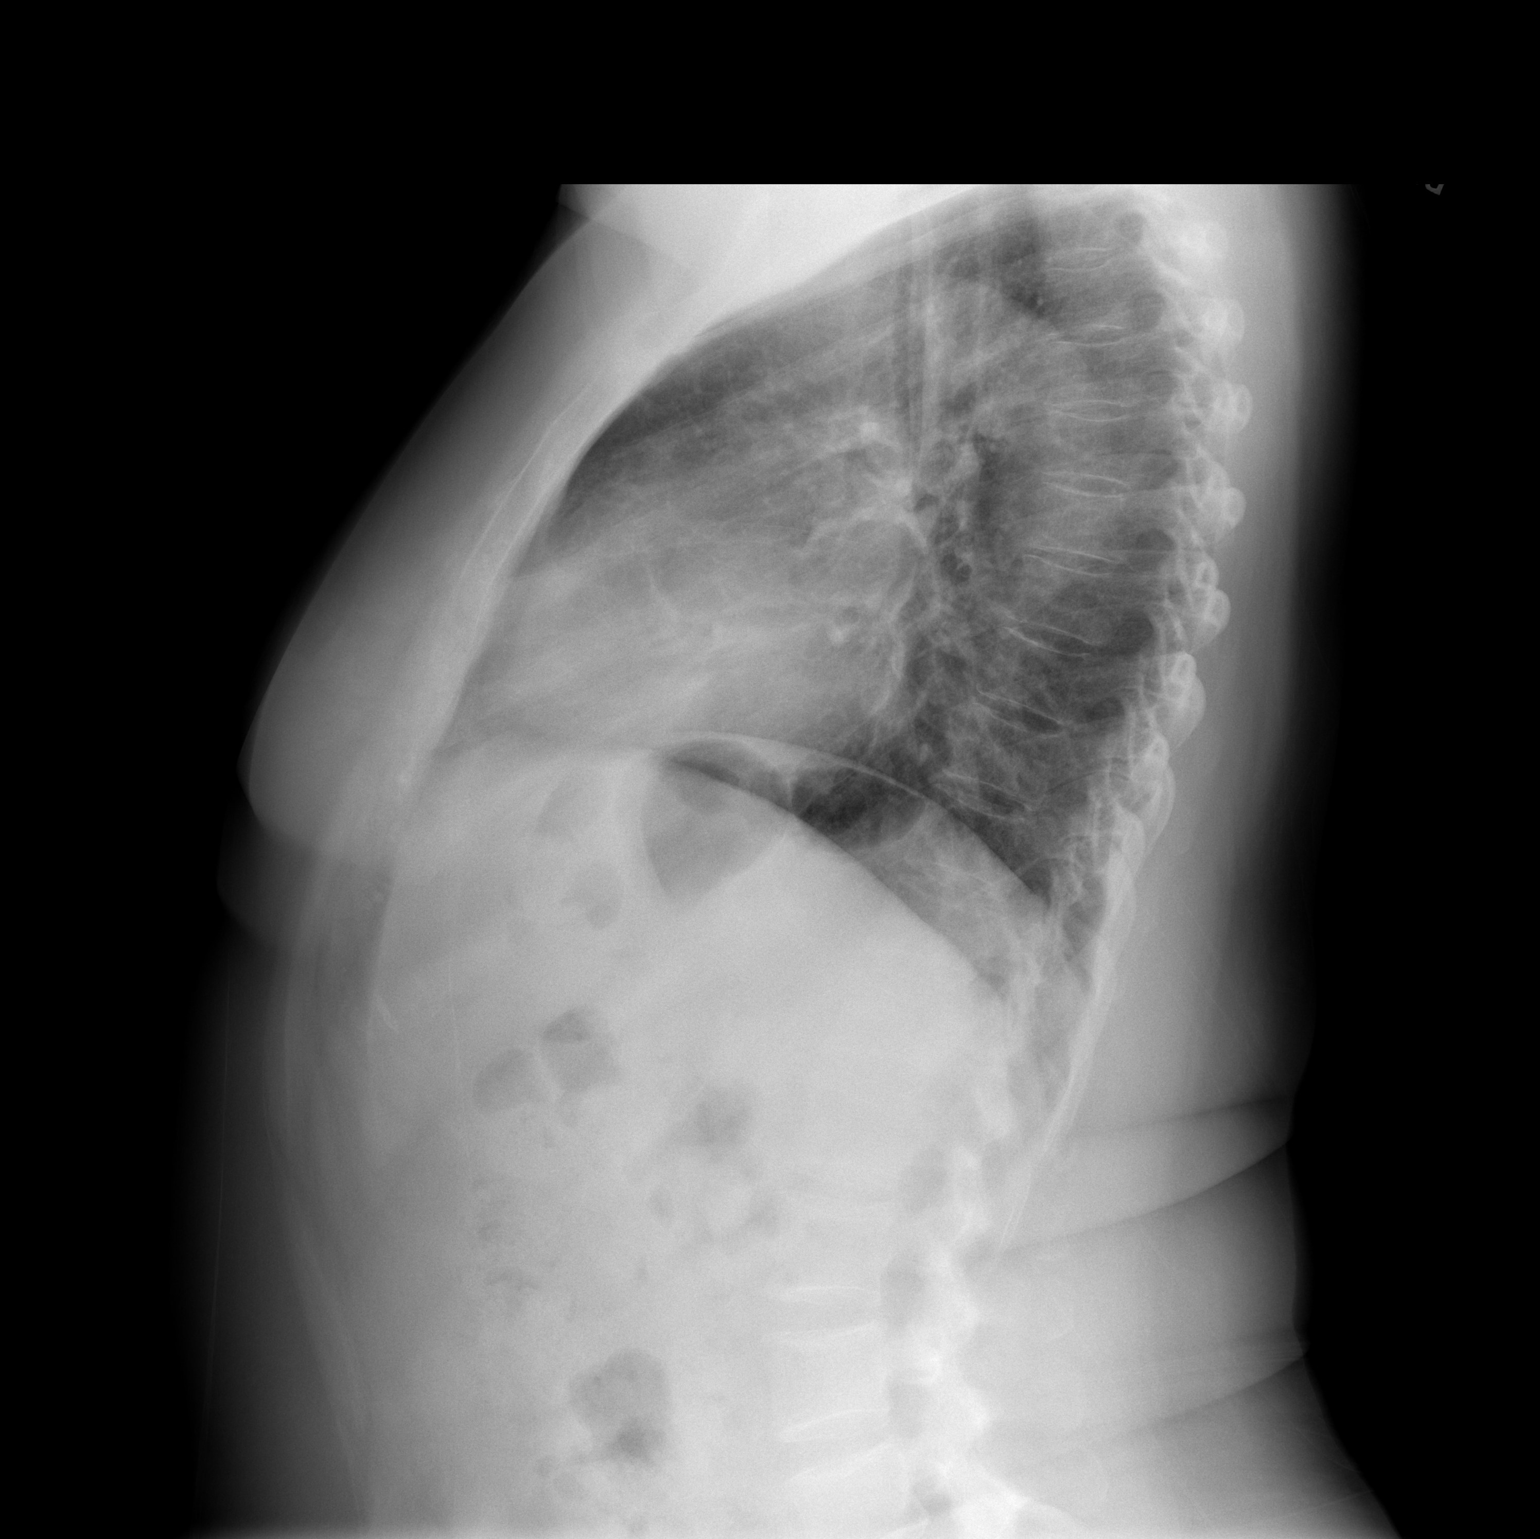

[2 of 2 positions shown; findings below may reference images not displayed]

FINDINGS: There is mild left base atelectasis. Lungs elsewhere clear. Heart is
mildly enlarged with pulmonary vascularity normal. No adenopathy. No
bone lesions.
IMPRESSION: Left base atelectasis. No frank edema or consolidation. Mild cardiac
enlargement.

## 2020-01-01 ENCOUNTER — Other Ambulatory Visit: Payer: Self-pay | Admitting: Physician Assistant

## 2020-01-01 DIAGNOSIS — Z1231 Encounter for screening mammogram for malignant neoplasm of breast: Secondary | ICD-10-CM

## 2020-02-11 ENCOUNTER — Ambulatory Visit
Admission: RE | Admit: 2020-02-11 | Discharge: 2020-02-11 | Disposition: A | Discharge status: Home / Self Care | Payer: BC Managed Care – PPO | Source: Ambulatory Visit

## 2020-02-11 ENCOUNTER — Other Ambulatory Visit: Payer: Self-pay

## 2020-02-11 DIAGNOSIS — Z1231 Encounter for screening mammogram for malignant neoplasm of breast: Secondary | ICD-10-CM
